# Patient Record
Sex: Female | Born: 1980 | Race: Black or African American | Hispanic: No | Marital: Single | Smoking: Current every day smoker
Health system: Southern US, Community
[De-identification: ages and names within clinical notes are randomized; demographics above are authoritative.]

## PROBLEM LIST (undated history)

## (undated) ENCOUNTER — Inpatient Hospital Stay: Payer: Self-pay

## (undated) DIAGNOSIS — G43909 Migraine, unspecified, not intractable, without status migrainosus: Secondary | ICD-10-CM

## (undated) DIAGNOSIS — R55 Syncope and collapse: Secondary | ICD-10-CM

---

## 2004-12-16 ENCOUNTER — Emergency Department: Payer: Self-pay | Admitting: Internal Medicine

## 2005-05-12 ENCOUNTER — Emergency Department: Payer: Self-pay | Admitting: Emergency Medicine

## 2005-05-15 ENCOUNTER — Ambulatory Visit: Payer: Self-pay | Admitting: Obstetrics and Gynecology

## 2005-05-21 ENCOUNTER — Emergency Department: Payer: Self-pay | Admitting: Emergency Medicine

## 2005-05-22 ENCOUNTER — Ambulatory Visit: Payer: Self-pay | Admitting: Emergency Medicine

## 2005-06-12 ENCOUNTER — Emergency Department: Payer: Self-pay | Admitting: Emergency Medicine

## 2005-07-25 ENCOUNTER — Ambulatory Visit: Payer: Self-pay | Admitting: Family Medicine

## 2005-07-27 ENCOUNTER — Emergency Department: Payer: Self-pay | Admitting: Internal Medicine

## 2005-09-07 ENCOUNTER — Observation Stay: Payer: Self-pay

## 2005-11-02 ENCOUNTER — Observation Stay: Payer: Self-pay

## 2005-11-30 ENCOUNTER — Observation Stay: Payer: Self-pay | Admitting: Obstetrics and Gynecology

## 2005-12-20 ENCOUNTER — Observation Stay: Payer: Self-pay | Admitting: Certified Nurse Midwife

## 2005-12-21 ENCOUNTER — Inpatient Hospital Stay: Payer: Self-pay

## 2005-12-21 ENCOUNTER — Observation Stay: Payer: Self-pay | Admitting: Certified Nurse Midwife

## 2007-03-23 ENCOUNTER — Emergency Department: Payer: Self-pay | Admitting: Emergency Medicine

## 2007-05-18 ENCOUNTER — Emergency Department: Payer: Self-pay | Admitting: Internal Medicine

## 2008-03-31 ENCOUNTER — Emergency Department: Payer: Self-pay | Admitting: Emergency Medicine

## 2009-12-17 ENCOUNTER — Emergency Department: Payer: Self-pay | Admitting: Emergency Medicine

## 2010-10-03 ENCOUNTER — Emergency Department: Payer: Self-pay | Admitting: Unknown Physician Specialty

## 2010-10-11 ENCOUNTER — Emergency Department: Payer: Self-pay | Admitting: Emergency Medicine

## 2010-10-26 ENCOUNTER — Emergency Department: Payer: Self-pay | Admitting: Internal Medicine

## 2010-11-10 ENCOUNTER — Emergency Department: Payer: Self-pay | Admitting: Emergency Medicine

## 2010-11-15 ENCOUNTER — Emergency Department: Payer: Self-pay | Admitting: Emergency Medicine

## 2011-06-04 ENCOUNTER — Emergency Department: Payer: Self-pay | Admitting: Emergency Medicine

## 2011-06-04 LAB — URINALYSIS, COMPLETE
Bacteria: NONE SEEN
Bilirubin,UR: NEGATIVE
Glucose,UR: NEGATIVE mg/dL (ref 0–75)
Leukocyte Esterase: NEGATIVE
Nitrite: NEGATIVE
Ph: 6 (ref 4.5–8.0)
Squamous Epithelial: 2
WBC UR: 4 /HPF (ref 0–5)

## 2011-06-04 LAB — CBC
MCHC: 33.4 g/dL (ref 32.0–36.0)
Platelet: 316 10*3/uL (ref 150–440)
RDW: 14.1 % (ref 11.5–14.5)
WBC: 8.1 10*3/uL (ref 3.6–11.0)

## 2011-06-08 ENCOUNTER — Ambulatory Visit: Payer: Self-pay | Admitting: Obstetrics & Gynecology

## 2011-06-08 LAB — CBC
HGB: 11.6 g/dL — ABNORMAL LOW (ref 12.0–16.0)
MCH: 28.8 pg (ref 26.0–34.0)
MCHC: 33.2 g/dL (ref 32.0–36.0)
Platelet: 345 10*3/uL (ref 150–440)
RDW: 13.1 % (ref 11.5–14.5)

## 2011-06-12 LAB — PATHOLOGY REPORT

## 2011-08-10 ENCOUNTER — Emergency Department: Payer: Self-pay | Admitting: *Deleted

## 2011-08-10 LAB — COMPREHENSIVE METABOLIC PANEL
Albumin: 4 g/dL (ref 3.4–5.0)
Alkaline Phosphatase: 55 U/L (ref 50–136)
BUN: 11 mg/dL (ref 7–18)
Bilirubin,Total: 0.3 mg/dL (ref 0.2–1.0)
Calcium, Total: 8.6 mg/dL (ref 8.5–10.1)
Chloride: 107 mmol/L (ref 98–107)
Co2: 26 mmol/L (ref 21–32)
EGFR (African American): >60
EGFR (Non-African Amer.): >60
Osmolality: 276 (ref 275–301)
Potassium: 3.8 mmol/L (ref 3.5–5.1)
SGOT(AST): 18 U/L (ref 15–37)

## 2011-08-10 LAB — URINALYSIS, COMPLETE
Bacteria: NONE SEEN
Glucose,UR: NEGATIVE mg/dL (ref 0–75)
Leukocyte Esterase: NEGATIVE
Ph: 7 (ref 4.5–8.0)
Protein: NEGATIVE
RBC,UR: 3 /HPF (ref 0–5)
Specific Gravity: 1.016 (ref 1.003–1.030)
Squamous Epithelial: 5
WBC UR: 1 /HPF (ref 0–5)

## 2011-08-10 LAB — CBC
MCH: 28.6 pg (ref 26.0–34.0)
MCHC: 33.4 g/dL (ref 32.0–36.0)
MCV: 86 fL (ref 80–100)
Platelet: 274 10*3/uL (ref 150–440)
RDW: 13.4 % (ref 11.5–14.5)

## 2011-09-28 ENCOUNTER — Ambulatory Visit: Payer: Self-pay | Admitting: Family Medicine

## 2011-10-02 ENCOUNTER — Emergency Department: Payer: Self-pay | Admitting: Emergency Medicine

## 2011-10-21 ENCOUNTER — Emergency Department: Payer: Self-pay | Admitting: Emergency Medicine

## 2011-10-21 LAB — COMPREHENSIVE METABOLIC PANEL
Anion Gap: 8 (ref 7–16)
BUN: 7 mg/dL (ref 7–18)
Chloride: 107 mmol/L (ref 98–107)
Co2: 23 mmol/L (ref 21–32)
EGFR (African American): >60
EGFR (Non-African Amer.): >60
Osmolality: 273 (ref 275–301)
Potassium: 3.7 mmol/L (ref 3.5–5.1)
SGOT(AST): 20 U/L (ref 15–37)
SGPT (ALT): 15 U/L
Total Protein: 7.5 g/dL (ref 6.4–8.2)

## 2011-10-21 LAB — PREGNANCY, URINE: Pregnancy Test, Urine: POSITIVE m[IU]/mL

## 2011-10-21 LAB — URINALYSIS, COMPLETE
Bacteria: NONE SEEN
Bilirubin,UR: NEGATIVE
Blood: NEGATIVE
Ketone: NEGATIVE
Leukocyte Esterase: NEGATIVE
Ph: 8 (ref 4.5–8.0)
RBC,UR: 2 /HPF (ref 0–5)
Squamous Epithelial: 3
WBC UR: NONE SEEN /HPF (ref 0–5)

## 2011-10-21 LAB — WET PREP, GENITAL

## 2011-10-21 LAB — CBC
MCHC: 32.6 g/dL (ref 32.0–36.0)
Platelet: 272 10*3/uL (ref 150–440)
RDW: 13.8 % (ref 11.5–14.5)
WBC: 6.2 10*3/uL (ref 3.6–11.0)

## 2011-11-29 ENCOUNTER — Emergency Department: Payer: Self-pay | Admitting: Emergency Medicine

## 2011-11-29 LAB — COMPREHENSIVE METABOLIC PANEL
Alkaline Phosphatase: 55 U/L (ref 50–136)
Anion Gap: 7 (ref 7–16)
Bilirubin,Total: 0.2 mg/dL (ref 0.2–1.0)
Co2: 24 mmol/L (ref 21–32)
EGFR (Non-African Amer.): >60
Osmolality: 265 (ref 275–301)
Potassium: 3.7 mmol/L (ref 3.5–5.1)
Sodium: 134 mmol/L — ABNORMAL LOW (ref 136–145)

## 2011-11-29 LAB — URINALYSIS, COMPLETE
Bilirubin,UR: NEGATIVE
Blood: NEGATIVE
Glucose,UR: NEGATIVE mg/dL (ref 0–75)
Leukocyte Esterase: NEGATIVE
Nitrite: NEGATIVE
Ph: 7 (ref 4.5–8.0)
RBC,UR: 1 /HPF (ref 0–5)
Squamous Epithelial: 4

## 2011-11-29 LAB — CBC
HGB: 11.2 g/dL — ABNORMAL LOW (ref 12.0–16.0)
MCH: 29 pg (ref 26.0–34.0)
MCHC: 34 g/dL (ref 32.0–36.0)
Platelet: 276 10*3/uL (ref 150–440)
RBC: 3.85 10*6/uL (ref 3.80–5.20)
RDW: 13.7 % (ref 11.5–14.5)

## 2011-11-29 LAB — HCG, QUANTITATIVE, PREGNANCY: Beta Hcg, Quant.: 22281 m[IU]/mL — ABNORMAL HIGH

## 2011-12-26 ENCOUNTER — Ambulatory Visit: Payer: Self-pay | Admitting: Family Medicine

## 2012-01-05 ENCOUNTER — Observation Stay: Payer: Self-pay

## 2012-02-02 ENCOUNTER — Observation Stay: Payer: Self-pay | Admitting: Obstetrics and Gynecology

## 2012-02-02 LAB — URINALYSIS, COMPLETE
Bacteria: NONE SEEN
Bilirubin,UR: NEGATIVE
Blood: NEGATIVE
Glucose,UR: NEGATIVE mg/dL (ref 0–75)
Leukocyte Esterase: NEGATIVE
Protein: NEGATIVE
Specific Gravity: 1.018 (ref 1.003–1.030)
Squamous Epithelial: 2

## 2012-02-03 LAB — URINE CULTURE

## 2012-04-13 ENCOUNTER — Observation Stay: Payer: Self-pay

## 2012-04-13 LAB — URINALYSIS, COMPLETE
Blood: NEGATIVE
Nitrite: NEGATIVE
Ph: 7 (ref 4.5–8.0)
RBC,UR: <1 /HPF (ref 0–5)
Squamous Epithelial: 3

## 2012-05-02 ENCOUNTER — Observation Stay: Payer: Self-pay | Admitting: Obstetrics and Gynecology

## 2012-05-02 LAB — PIH PROFILE
Anion Gap: 8 (ref 7–16)
BUN: 4 mg/dL — ABNORMAL LOW (ref 7–18)
Calcium, Total: 8.5 mg/dL (ref 8.5–10.1)
Chloride: 108 mmol/L — ABNORMAL HIGH (ref 98–107)
Co2: 21 mmol/L (ref 21–32)
EGFR (African American): >60
EGFR (Non-African Amer.): >60
HCT: 31.7 % — ABNORMAL LOW (ref 35.0–47.0)
HGB: 10.7 g/dL — ABNORMAL LOW (ref 12.0–16.0)
MCHC: 33.9 g/dL (ref 32.0–36.0)
Osmolality: 269 (ref 275–301)
RBC: 3.65 10*6/uL — ABNORMAL LOW (ref 3.80–5.20)
Sodium: 137 mmol/L (ref 136–145)
Uric Acid: 4.4 mg/dL (ref 2.6–6.0)

## 2012-05-02 LAB — PROTEIN / CREATININE RATIO, URINE
Creatinine, Urine: 83.2 mg/dL (ref 30.0–125.0)
Protein/Creat. Ratio: 180 mg/gCREAT (ref 0–200)

## 2012-05-07 ENCOUNTER — Inpatient Hospital Stay: Payer: Self-pay | Admitting: Obstetrics and Gynecology

## 2012-05-07 LAB — CBC WITH DIFFERENTIAL/PLATELET
Basophil %: 0.8 %
Eosinophil #: 0.2 10*3/uL (ref 0.0–0.7)
HCT: 34.2 % — ABNORMAL LOW (ref 35.0–47.0)
Lymphocyte %: 26.7 %
MCH: 29 pg (ref 26.0–34.0)
Monocyte #: 0.8 x10 3/mm (ref 0.2–0.9)
Monocyte %: 10.4 %
Neutrophil %: 59.6 %
Platelet: 315 10*3/uL (ref 150–440)
RBC: 3.91 10*6/uL (ref 3.80–5.20)
RDW: 15.3 % — ABNORMAL HIGH (ref 11.5–14.5)
WBC: 8.2 10*3/uL (ref 3.6–11.0)

## 2012-05-08 LAB — HEMATOCRIT: HCT: 28.7 % — ABNORMAL LOW (ref 35.0–47.0)

## 2013-08-05 ENCOUNTER — Emergency Department: Payer: Self-pay | Admitting: Emergency Medicine

## 2013-08-05 LAB — URINALYSIS, COMPLETE
BILIRUBIN, UR: NEGATIVE
Blood: NEGATIVE
GLUCOSE, UR: NEGATIVE mg/dL (ref 0–75)
Ketone: NEGATIVE
Nitrite: NEGATIVE
Ph: 6 (ref 4.5–8.0)
Protein: NEGATIVE
RBC,UR: NONE SEEN /HPF (ref 0–5)
Specific Gravity: 1.006 (ref 1.003–1.030)
Squamous Epithelial: 1
WBC UR: 2 /HPF (ref 0–5)

## 2013-08-05 LAB — GC/CHLAMYDIA PROBE AMP

## 2013-08-05 LAB — WET PREP, GENITAL

## 2013-12-07 ENCOUNTER — Emergency Department: Payer: Self-pay | Admitting: Emergency Medicine

## 2013-12-07 LAB — CBC WITH DIFFERENTIAL/PLATELET
Basophil #: 0.1 10*3/uL (ref 0.0–0.1)
Basophil %: 1.1 %
Eosinophil #: 0.4 10*3/uL (ref 0.0–0.7)
Eosinophil %: 7.2 %
HCT: 37.3 % (ref 35.0–47.0)
HGB: 11.8 g/dL — ABNORMAL LOW (ref 12.0–16.0)
Lymphocyte #: 2 10*3/uL (ref 1.0–3.6)
Lymphocyte %: 34.1 %
MCH: 27.3 pg (ref 26.0–34.0)
MCHC: 31.7 g/dL — ABNORMAL LOW (ref 32.0–36.0)
MCV: 86 fL (ref 80–100)
Monocyte #: 0.5 x10 3/mm (ref 0.2–0.9)
Monocyte %: 8.6 %
Neutrophil #: 2.9 10*3/uL (ref 1.4–6.5)
Neutrophil %: 49 %
Platelet: 297 10*3/uL (ref 150–440)
RBC: 4.34 10*6/uL (ref 3.80–5.20)
RDW: 13.4 % (ref 11.5–14.5)
WBC: 5.9 10*3/uL (ref 3.6–11.0)

## 2013-12-07 LAB — MONONUCLEOSIS SCREEN: MONO TEST: NEGATIVE

## 2013-12-11 LAB — BETA STREP CULTURE(ARMC)

## 2013-12-26 ENCOUNTER — Emergency Department: Payer: Self-pay | Admitting: Emergency Medicine

## 2014-01-20 ENCOUNTER — Emergency Department: Payer: Self-pay | Admitting: Emergency Medicine

## 2014-01-20 LAB — COMPREHENSIVE METABOLIC PANEL
Albumin: 3.9 g/dL (ref 3.4–5.0)
Alkaline Phosphatase: 51 U/L
Anion Gap: 9 (ref 7–16)
BILIRUBIN TOTAL: 0.4 mg/dL (ref 0.2–1.0)
BUN: 13 mg/dL (ref 7–18)
CALCIUM: 8.3 mg/dL — AB (ref 8.5–10.1)
CO2: 22 mmol/L (ref 21–32)
CREATININE: 0.91 mg/dL (ref 0.60–1.30)
Chloride: 112 mmol/L — ABNORMAL HIGH (ref 98–107)
EGFR (African American): >60
EGFR (Non-African Amer.): >60
Glucose: 128 mg/dL — ABNORMAL HIGH (ref 65–99)
OSMOLALITY: 287 (ref 275–301)
Potassium: 3.6 mmol/L (ref 3.5–5.1)
SGOT(AST): 19 U/L (ref 15–37)
SGPT (ALT): 15 U/L
Sodium: 143 mmol/L (ref 136–145)
Total Protein: 7.8 g/dL (ref 6.4–8.2)

## 2014-01-20 LAB — CBC
HCT: 36.8 % (ref 35.0–47.0)
HGB: 12 g/dL (ref 12.0–16.0)
MCH: 27.9 pg (ref 26.0–34.0)
MCHC: 32.8 g/dL (ref 32.0–36.0)
MCV: 85 fL (ref 80–100)
Platelet: 327 10*3/uL (ref 150–440)
RBC: 4.33 10*6/uL (ref 3.80–5.20)
RDW: 13.5 % (ref 11.5–14.5)
WBC: 5.5 10*3/uL (ref 3.6–11.0)

## 2014-01-20 LAB — TROPONIN I: Troponin-I: <0.02 ng/mL

## 2014-01-20 LAB — TSH: Thyroid Stimulating Horm: 0.612 u[IU]/mL

## 2014-03-16 ENCOUNTER — Emergency Department: Payer: Self-pay | Admitting: Emergency Medicine

## 2014-04-15 ENCOUNTER — Emergency Department: Payer: Self-pay | Admitting: Emergency Medicine

## 2014-06-15 ENCOUNTER — Emergency Department: Payer: Self-pay | Admitting: Student

## 2014-07-26 ENCOUNTER — Emergency Department
Admit: 2014-07-26 | Disposition: A | Discharge status: Home / Self Care | Payer: Self-pay | Admitting: Emergency Medicine

## 2014-07-26 NOTE — Op Note (Signed)
PATIENT NAME:  Nancy Watson, [name] MR#:  [id number] DATE OF BIRTH:  March 03, 1981  DATE OF PROCEDURE:  06/08/2011  PREOPERATIVE DIAGNOSIS: Missed abortion.   POSTOPERATIVE DIAGNOSIS: Missed abortion.   PROCEDURE: Suction dilatation and curettage.   SURGEON: Dierdre Searles. Paul Anvi Mangal, M.D.   ANESTHESIA: General.   ESTIMATED BLOOD LOSS: Minimal.  COMPLICATIONS: None.  SPECIMEN: Products of conception.  DISPOSITION: To the recovery room in stable condition.   TECHNIQUE: The patient is prepped and draped in the usual sterile fashion after adequate anesthesia is obtained in the dorsal lithotomy position. Bladder is drained with a Robinson catheter. Speculum is placed and the cervix is grasped with a tenaculum. The cervix is dilated to a size 18 Pratt dilator. An 8 mm rigid suction curette is placed. Aspiration of products of conception is performed. A gentle curettage with a banjo curette is then performed with repeat aspiration. Excellent hemostasis is noted. The tenaculum is removed. The patient goes to the recovery room in stable condition. All sponge, instrument, and needle counts are correct.   ____________________________ R. Annamarie MajorPaul Alisah Grandberry, MD rph:ap D: 06/08/2011 17:31:28 ET T: 06/09/2011 07:03:03 ET JOB#: [id number]  cc: Dierdre Searles. Paul Konstantina Nachreiner, MD, <Dictator> Nadara MustardOBERT P Jyasia Markoff MD ELECTRONICALLY SIGNED 06/09/2011 9:11

## 2014-08-11 NOTE — H&P (Signed)
L&D Evaluation:  History Expanded:   HPI 34 yo G3P2002 with EDD of 05/18/12, presents at 35 weeks with c/o mucousy vaginal d/c and contractions. +FM, no VB. [facility] at [facility], [facility]. H/o SVD x 2 with fairly rapid labors. H/o Cardiac Arrythmia - records state we are awaiting records from [facility] regarding this.    Blood Type (Maternal) O positive    Group B Strep Results Maternal (Result >5wks must be treated as unknown) unknown/result > 5 weeks ago    Maternal HIV Negative    Maternal Syphilis Ab Nonreactive    Maternal Varicella Immune    Rubella Results (Maternal) immune    Maternal T-Dap Unknown    Patient's Medical History Cardiac Arrythmia?    Patient's Surgical History none    Medications Pre Natal Vitamins    Allergies NKDA    Social History tobacco   ROS:   ROS se HPI   Exam:   Vital Signs stable    General no apparent distress    Mental Status clear    Abdomen gravid, tender with contractions    Fetal Position vertex    Pelvic no external lesions, speculum exam: no lesions noted, cervix 2/75/-2    Mebranes Intact    FHT normal rate with no decels    Ucx regular, q 2-5 minutes    Other wet mount negative UA unremarkable   Impression:   Impression PTL   Plan:   Comments Terbutaline IV bolus/hydration Ampicillin   Electronic Signatures: Vella KohlerBrothers, Midori Dado K (CNM)  (Signed 12-Jan-14 00:31)  Authored: L&D Evaluation   Last Updated: 12-Jan-14 00:31 by Vella KohlerBrothers, Deloyd Handy K (CNM)

## 2014-08-11 NOTE — H&P (Signed)
L&D Evaluation:  History Expanded:   HPI 34 yo AA female, EDD of 05/18/12 at 38 weeks 3 days who present in active labor. [facility], h/o genital herpes, and multiple STDs history, plans BTL after this baby papers signed 03/07/12.    Gravida 5    Term 2    PreTerm 0    Abortion 2    Living 2    Blood Type (Maternal) O positive    Group B Strep Results Maternal (Result >5wks must be treated as unknown) positive    Maternal HIV Negative    Maternal Syphilis Ab Nonreactive    Maternal Varicella Immune    Rubella Results (Maternal) immune    EDC 18-May-2012    Presents with contractions    Patient's Medical History Heart Disease  vasovagal reactions    Patient's Surgical History D&C  EAb x1 and SAb x1,    Medications Pre Natal Vitamins  Valtrex    Allergies NKDA    Social History tobacco    Family History Non-Contributory   ROS:   ROS All systems were reviewed.  HEENT, CNS, GI, GU, Respiratory, CV, Renal and Musculoskeletal systems were found to be normal.   Exam:   Vital Signs stable    General no apparent distress    Mental Status clear    Abdomen gravid, tender with contractions    Estimated Fetal Weight Average for gestational age    Fetal Position vertex    Back no CVAT    Edema no edema    Pelvic no external lesions, 8 cm on admission - complete shortly afterwards    FHT normal rate with no decels    Skin dry   Impression:   Impression active labor   Plan:   Comments Pt delivered shortly after arrival on unit. See delivery note.    Follow Up Appointment already scheduled   Electronic Signatures: Vella KohlerBrothers, Lelia Jons K (CNM)  (Signed 04-Feb-14 02:49)  Authored: L&D Evaluation   Last Updated: 04-Feb-14 02:49 by Vella KohlerBrothers, Anoushka Divito K (CNM)

## 2014-08-11 NOTE — H&P (Signed)
L&D Evaluation:  History Expanded:   HPI 34 yo AA female at 37 weeks 4 days who present with wtery discharge and ctx thinks she is in labor, her last babues cam fastpt ha HO gebital herpes, and multiple STDs history, plans BTL after this baby papers signed 03/07/12, Pt is GBS pos,    Gravida 5    Term 2    PreTerm 0    Abortion 2    Living 2    Blood Type (Maternal) O positive    Group B Strep Results Maternal (Result >5wks must be treated as unknown) positive    Maternal HIV Negative    Maternal Syphilis Ab Nonreactive    Maternal Varicella Immune    Rubella Results (Maternal) immune    EDC 18-May-2012    Presents with contractions    Patient's Medical History Heart Disease  vasovagal mreactions    Patient's Surgical History D&C  EAb x1 and SAb x1,    Medications Pre Natal Vitamins    Allergies NKDA    Social History tobacco    Family History Non-Contributory   ROS:   ROS All systems were reviewed.  HEENT, CNS, GI, GU, Respiratory, CV, Renal and Musculoskeletal systems were found to be normal.   Exam:   Urine Protein negative dipstick    General no apparent distress    Mental Status clear    Chest clear    Heart normal sinus rhythm    Abdomen gravid, tender with contractions    Estimated Fetal Weight Average for gestational age    Fetal Position vertex    Back no CVAT    Edema no edema    Pelvic no external lesions, cervix 3-4 and the same after two hours    Mebranes Intact, ballotable head    FHT normal rate with no decels    Ucx irregular    Skin dry   Plan:   Plan monitor contractions and for cervical change, monitor BP    Comments pih labs all normal and no change in cervix    Follow Up Appointment already scheduled   Electronic Signatures: Adria DevonKlett, Isatou Agredano (MD)  (Signed 30-Jan-14 20:49)  Authored: L&D Evaluation   Last Updated: 30-Jan-14 20:49 by Adria DevonKlett, Teniqua Marron (MD)

## 2014-10-21 ENCOUNTER — Emergency Department
Admission: EM | Admit: 2014-10-21 | Discharge: 2014-10-21 | Disposition: A | Discharge status: Home / Self Care | Payer: Medicaid Other | Attending: Emergency Medicine | Admitting: Emergency Medicine

## 2014-10-21 ENCOUNTER — Encounter: Payer: Self-pay | Admitting: Emergency Medicine

## 2014-10-21 DIAGNOSIS — M6281 Muscle weakness (generalized): Secondary | ICD-10-CM | POA: Diagnosis not present

## 2014-10-21 DIAGNOSIS — Z720 Tobacco use: Secondary | ICD-10-CM | POA: Insufficient documentation

## 2014-10-21 DIAGNOSIS — M545 Low back pain, unspecified: Secondary | ICD-10-CM

## 2014-10-21 MED ORDER — CYCLOBENZAPRINE HCL 10 MG PO TABS: 10.0000 mg | ORAL_TABLET | Freq: Three times a day (TID) | ORAL | Status: DC | PRN

## 2014-10-21 MED ORDER — IBUPROFEN 800 MG PO TABS: 800.0000 mg | ORAL_TABLET | Freq: Three times a day (TID) | ORAL | Status: DC | PRN

## 2014-10-21 MED ORDER — TRAMADOL HCL 50 MG PO TABS: 50.0000 mg | ORAL_TABLET | Freq: Four times a day (QID) | ORAL | Status: DC | PRN

## 2014-10-21 NOTE — ED Notes (Signed)
Pt c/o lower back pain, mid to right pain that started x2 days ago. Denies injury.

## 2014-10-21 NOTE — ED Provider Notes (Signed)
[facility] Emergency Department Provider Note  ____________________________________________  Time seen: Approximately 4:23 PM  I have reviewed the triage vital signs and the nursing notes.   HISTORY  Chief Complaint Back Pain   HPI Nancy Watson is a 34 y.o. female presenting to the emergency room for back pain that started Monday. Her pain is an 8 out of 10.  She notes that the pain radiates throughout the right side of her back and through her buttocks but has no pain in her right leg.  She said there was no trauma or heavy lifting prior to the onset.  Patient denies numbness, tingling, or urinary incontinence.  She has had to sleep on her left side and last night she says she slept sitting up due to the pain.  She has not tried any medication and nothing seems to relieve the pain.  She is still able to walk but with severe pain.   No past medical history on file.  There are no active problems to display for this patient.   No past surgical history on file.  Current Outpatient Rx  Name  Route  Sig  Dispense  Refill  . cyclobenzaprine (FLEXERIL) 10 MG tablet   Oral   Take 1 tablet (10 mg total) by mouth every 8 (eight) hours as needed for muscle spasms.   15 tablet   0   . ibuprofen (ADVIL,MOTRIN) 800 MG tablet   Oral   Take 1 tablet (800 mg total) by mouth every 8 (eight) hours as needed for moderate pain.   15 tablet   0   . traMADol (ULTRAM) 50 MG tablet   Oral   Take 1 tablet (50 mg total) by mouth every 6 (six) hours as needed for moderate pain.   12 tablet   0     Allergies Review of patient's allergies indicates no known allergies.  No family history on file.  Social History History  Substance Use Topics  . Smoking status: Current Every Day Smoker  . Smokeless tobacco: Not on file  . Alcohol Use: No    Review of Systems Constitutional: No fever/chills ENT: No sore throat. Cardiovascular: Denies chest  pain. Respiratory: Denies shortness of breath. Gastrointestinal: No abdominal pain.  No nausea, no vomiting.  No diarrhea.  No constipation. Genitourinary: Negative for urinary incontinence. Musculoskeletal: Positive for right sided back pain that radiates to the buttocks. Skin: Negative for rash. Neurological: Negative for headaches or numbness.  Positive for right lower extremity weakness.  10-point ROS otherwise negative.  ____________________________________________   PHYSICAL EXAM:  VITAL SIGNS: ED Triage Vitals  Enc Vitals Group     BP 10/21/14 1620 124/89 mmHg     Pulse Rate 10/21/14 1620 83     Resp --      Temp 10/21/14 1620 98.6 F (37 C)     Temp Source 10/21/14 1620 Oral     SpO2 10/21/14 1620 99 %     Weight 10/21/14 1620 147 lb (66.679 kg)     Height 10/21/14 1620 5\' 7"  (1.702 m)     Head Cir --      Peak Flow --      Pain Score 10/21/14 1622 8     Pain Loc --      Pain Edu? --      Excl. in GC? --     Constitutional: Alert and oriented. Appears uncomfortable sitting on the bed.  Moved very slowly and winced with movement  during the exam. Neck: No cervical spine tenderness to palpation. Cardiovascular: Normal rate, regular rhythm. Grossly normal heart sounds.  Good peripheral circulation. Respiratory: Normal respiratory effort.  No retractions. Lungs CTAB. Gastrointestinal: Soft and nontender. No distention. No abdominal bruits. No CVA tenderness. Musculoskeletal: Tenderness to palpation over the lumbosacral vertebrae and diffuse tenderness over the right side of the back.  Decreased ROM of the right leg and hip. Unable to lift right leg without assistance during most of the exam. Neurologic:  Normal speech and language. No gross focal neurologic deficits are appreciated. No gait instability. Skin:  Skin is warm, dry and intact. No rash noted. Psychiatric: Mood and affect are normal. Speech and behavior are  normal.  ____________________________________________   LABS (all labs ordered are listed, but only abnormal results are displayed)  Labs Reviewed - No data to display ____________________________________________  EKG   ____________________________________________  RADIOLOGY   ____________________________________________   PROCEDURES  Procedure(s) performed: None  Critical Care performed: No  ____________________________________________   INITIAL IMPRESSION / ASSESSMENT AND PLAN / ED COURSE  Pertinent labs & imaging results that were available during my care of the patient were reviewed by me and considered in my medical decision making (see chart for details).  Acute mid low back pain. Patient will be given back exercise sheet. Patient given prescription for Flexeril and ibuprofen and tramadol. Patient advised follow-up family doctor or return by ER condition worsens. ____________________________________________   FINAL CLINICAL IMPRESSION(S) / ED DIAGNOSES  Final diagnoses:  Back pain at L4-L5 level     Joni Reining, PA-C 10/21/14 1645  Emily Filbert, MD 10/22/14 1239

## 2014-11-10 ENCOUNTER — Emergency Department
Admission: EM | Admit: 2014-11-10 | Discharge: 2014-11-10 | Discharge status: Against Medical Advice | Payer: Medicaid Other | Attending: Emergency Medicine | Admitting: Emergency Medicine

## 2014-11-10 ENCOUNTER — Encounter: Payer: Self-pay | Admitting: Emergency Medicine

## 2014-11-10 DIAGNOSIS — R51 Headache: Secondary | ICD-10-CM | POA: Insufficient documentation

## 2014-11-10 DIAGNOSIS — Z720 Tobacco use: Secondary | ICD-10-CM | POA: Diagnosis not present

## 2014-11-10 HISTORY — DX: Migraine, unspecified, not intractable, without status migrainosus: G43.909

## 2014-11-10 NOTE — ED Notes (Signed)
Called for patient in waiting room x 3 no answer.  

## 2014-11-10 NOTE — ED Notes (Signed)
Called for patient in waiting room x 3 no answer.

## 2014-11-10 NOTE — ED Notes (Signed)
Pt to ed with  C/o hedache x 3 days, states hx of migraines, has tried otc motrin without relief. Denies weakness, denies blurred vision.  States sound and lights make head pain worse.

## 2014-12-17 ENCOUNTER — Encounter: Payer: Self-pay | Admitting: Emergency Medicine

## 2014-12-17 ENCOUNTER — Emergency Department
Admission: EM | Admit: 2014-12-17 | Discharge: 2014-12-17 | Disposition: A | Discharge status: Home / Self Care | Payer: Medicaid Other | Attending: Emergency Medicine | Admitting: Emergency Medicine

## 2014-12-17 DIAGNOSIS — Z720 Tobacco use: Secondary | ICD-10-CM | POA: Diagnosis not present

## 2014-12-17 DIAGNOSIS — G5601 Carpal tunnel syndrome, right upper limb: Secondary | ICD-10-CM | POA: Diagnosis not present

## 2014-12-17 DIAGNOSIS — M25531 Pain in right wrist: Secondary | ICD-10-CM | POA: Diagnosis present

## 2014-12-17 MED ORDER — MELOXICAM 15 MG PO TABS: 15.0000 mg | ORAL_TABLET | Freq: Every day | ORAL | Status: DC

## 2014-12-17 NOTE — ED Provider Notes (Signed)
[facility] Emergency Department Provider Note  ____________________________________________  Time seen: Approximately 9:34 AM  I have reviewed the triage vital signs and the nursing notes.   HISTORY  Chief Complaint Wrist Pain   HPI Nancy Watson is a 34 y.o. female to ED for a complaint of R wrist pain x 2 days. Denies injury. Denies loss of ROM or sensation. 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 Pain with movement of wrist and flexion of fingers. No parasthesias or numbness/tingling. Patient has not take anything for same PTA.   LOCATIONR wrist DURATIONx 2 days TIMINGsudden and constant SEVERITYsevere with movement, minor with rest QUALITYSharp CONTEXTfrequent use, repeated use MODIFYINGFACTORSrest ASSOCIATEDSYMPTOMSnone  Past Medical History  Diagnosis Date  . Migraines     There are no active problems to display for this patient.   History reviewed. No pertinent past surgical history.  Current Outpatient Rx  Name  Route  Sig  Dispense  Refill  . cyclobenzaprine (FLEXERIL) 10 MG tablet   Oral   Take 1 tablet (10 mg total) by mouth every 8 (eight) hours as needed for muscle spasms.   15 tablet   0   . ibuprofen (ADVIL,MOTRIN) 800 MG tablet   Oral   Take 1 tablet (800 mg total) by mouth every 8 (eight) hours as needed for moderate pain.   15 tablet   0   . meloxicam (MOBIC) 15 MG tablet   Oral   Take 1 tablet (15 mg total) by mouth daily.   30 tablet   0   . traMADol (ULTRAM) 50 MG tablet   Oral   Take 1 tablet (50 mg total) by mouth every 6 (six) hours as needed for moderate pain.   12 tablet   0     Allergies Review of patient's allergies indicates no known allergies.  History reviewed. No pertinent family history.  Social History Social History  Substance Use Topics  . Smoking status: Current Every Day Smoker    Types: Cigarettes  . Smokeless tobacco: None  . Alcohol Use:  No    Review of Systems Constitutional: No fever/chills Eyes: No visual changes. ENT: No sore throat. Cardiovascular: Denies chest pain. Respiratory: Denies shortness of breath. Gastrointestinal: No abdominal pain.  No nausea, no vomiting.  No diarrhea.  No constipation. Genitourinary: Negative for dysuria. Musculoskeletal: Negative for back pain. Positive for anterior wrist pain. Denies numbness/tingling/parasthesias. No loss of ROM. Skin: Negative for rash. Neurological: Negative for headaches, focal weakness or numbness.  10-point ROS otherwise negative.  ____________________________________________   PHYSICAL EXAM:  VITAL SIGNS: ED Triage Vitals  Enc Vitals Group     BP 12/17/14 0918 128/79 mmHg     Pulse Rate 12/17/14 0918 70     Resp 12/17/14 0918 20     Temp 12/17/14 0918 98.2 F (36.8 C)     Temp Source 12/17/14 0918 Oral     SpO2 12/17/14 0918 99 %     Weight 12/17/14 0918 149 lb (67.586 kg)     Height 12/17/14 0918 5\' 6"  (1.676 m)     Head Cir --      Peak Flow --      Pain Score 12/17/14 0918 10     Pain Loc --      Pain Edu? --      Excl. in GC? --     Constitutional: Alert and oriented. Well appearing and in no acute distress. Eyes: Conjunctivae are normal.  PERRL. EOMI. Head: Atraumatic. Nose: No congestion/rhinnorhea. Mouth/Throat: Mucous membranes are moist.  Oropharynx non-erythematous. Neck: No stridor. No cervical spine tenderness to palpation. Hematological/Lymphatic/Immunilogical: No cervical lymphadenopathy. Cardiovascular: Normal rate, regular rhythm. Grossly normal heart sounds.  Good peripheral circulation. Respiratory: Normal respiratory effort.  No retractions. Lungs CTAB. Gastrointestinal: Soft and nontender. No distention. No abdominal bruits. No CVA tenderness. Musculoskeletal: No lower extremity tenderness nor edema.  No joint effusions. Positive Tinel's and Phalen's. Full range of motion to wrist, pain with movement of both flexion  and extension. No abnormality noted to elbow or phalanges. No signs of trauma or wounds. No ecchymosis. No edema. Neurologic:  Normal speech and language. No gross focal neurologic deficits are appreciated. No gait instability. Good sensation distal right phalanges Skin:  Skin is warm, dry and intact. No rash noted. Psychiatric: Mood and affect are normal. Speech and behavior are normal.  ____________________________________________   LABS (all labs ordered are listed, but only abnormal results are displayed)  Labs Reviewed - No data to display ____________________________________________  EKG   ____________________________________________  RADIOLOGY   ____________________________________________   PROCEDURES  Procedure(s) performed:    ____________________________________________   INITIAL IMPRESSION / ASSESSMENT AND PLAN / ED COURSE  Pertinent labs & imaging results that were available during my care of the patient were reviewed by me and considered in my medical decision making (see chart for details).  Exam most consistent with carpal tunnel syndrome. No numbness no tingling. No neurologic deficits. Given specific follow-up structures to use medication daily 30 days. Use brace. Patient understands and verbalizes compliance. Patient will follow up with Ortho if symptoms continue past 3 weeks. ____________________________________________   FINAL CLINICAL IMPRESSION(S) / ED DIAGNOSES  Final diagnoses:  Carpal tunnel syndrome of right wrist      Racheal Patches, PA-C 12/17/14 1020

## 2014-12-17 NOTE — Discharge Instructions (Signed)
Carpal Tunnel Syndrome The carpal tunnel is a narrow area located on the palm side of your wrist. The tunnel is formed by the wrist bones and ligaments. Nerves, blood vessels, and tendons pass through the carpal tunnel. Repeated wrist motion or certain diseases may cause swelling within the tunnel. This swelling pinches the main nerve in the wrist (median nerve) and causes the painful hand and arm condition called carpal tunnel syndrome. CAUSES   Repeated wrist motions.  Wrist injuries.  Certain diseases like arthritis, diabetes, alcoholism, hyperthyroidism, and kidney failure.  Obesity.  Pregnancy. SYMPTOMS   A "pins and needles" feeling in your fingers or hand, especially in your thumb, index and middle fingers.  Tingling or numbness in your fingers or hand.  An aching feeling in your entire arm, especially when your wrist and elbow are bent for long periods of time.  Wrist pain that goes up your arm to your shoulder.  Pain that goes down into your palm or fingers.  A weak feeling in your hands. DIAGNOSIS  Your health care provider will take your history and perform a physical exam. An electromyography test may be needed. This test measures electrical signals sent out by your nerves into the muscles. The electrical signals are usually slowed by carpal tunnel syndrome. You may also need X-rays. TREATMENT  Carpal tunnel syndrome may clear up by itself. Your health care provider may recommend a wrist splint or medicine such as a nonsteroidal anti-inflammatory medicine. Cortisone injections may help. Sometimes, surgery may be needed to free the pinched nerve.  HOME CARE INSTRUCTIONS   Take all medicine as directed by your health care provider. Only take over-the-counter or prescription medicines for pain, discomfort, or fever as directed by your health care provider.  If you were given a splint to keep your wrist from bending, wear it as directed. It is important to wear the splint at  night. Wear the splint for as long as you have pain or numbness in your hand, arm, or wrist. This may take 1 to 2 months.  Rest your wrist from any activity that may be causing your pain. If your symptoms are work-related, you may need to talk to your employer about changing to a job that does not require using your wrist.  Put ice on your wrist after long periods of wrist activity.  Put ice in a plastic bag.  Place a towel between your skin and the bag.  Leave the ice on for 15-20 minutes, 03-04 times a day.  Keep all follow-up visits as directed by your health care provider. This includes any orthopedic referrals, physical therapy, and rehabilitation. Any delay in getting necessary care could result in a delay or failure of your condition to heal. SEEK IMMEDIATE MEDICAL CARE IF:   You have new, unexplained symptoms.  Your symptoms get worse and are not helped or controlled with medicines. MAKE SURE YOU:   Understand these instructions.  Will watch your condition.  Will get help right away if you are not doing well or get worse. Document Released: 03/17/2000 Document Revised: 08/04/2013 Document Reviewed: 02/03/2011 [employer] Patient Information 2015 [employer]. This information is not intended to replace advice given to you by your health care provider. Make sure you discuss any questions you have with your health care provider.  

## 2014-12-17 NOTE — ED Notes (Signed)
Pt to ed with c/o right wrist pain x 2 days, denies injury but states she does use wrist frequently at her work.  +pulse, movement and sensation in wrist.  Increased pain with movement.

## 2015-02-11 ENCOUNTER — Encounter: Payer: Self-pay | Admitting: *Deleted

## 2015-02-11 ENCOUNTER — Emergency Department
Admission: EM | Admit: 2015-02-11 | Discharge: 2015-02-11 | Disposition: A | Discharge status: Home / Self Care | Payer: Medicaid Other | Attending: Emergency Medicine | Admitting: Emergency Medicine

## 2015-02-11 ENCOUNTER — Emergency Department: Payer: Medicaid Other

## 2015-02-11 DIAGNOSIS — R0789 Other chest pain: Secondary | ICD-10-CM | POA: Diagnosis present

## 2015-02-11 DIAGNOSIS — Z720 Tobacco use: Secondary | ICD-10-CM | POA: Insufficient documentation

## 2015-02-11 DIAGNOSIS — J069 Acute upper respiratory infection, unspecified: Secondary | ICD-10-CM | POA: Insufficient documentation

## 2015-02-11 DIAGNOSIS — Z791 Long term (current) use of non-steroidal anti-inflammatories (NSAID): Secondary | ICD-10-CM | POA: Diagnosis not present

## 2015-02-11 HISTORY — DX: Syncope and collapse: R55

## 2015-02-11 MED ORDER — IBUPROFEN 600 MG PO TABS
600.0000 mg | ORAL_TABLET | Freq: Once | ORAL | Status: AC
  Administered 2015-02-11: 600 mg via ORAL
  Filled 2015-02-11: qty 1

## 2015-02-11 NOTE — ED Notes (Signed)
Patient transported to X-ray 

## 2015-02-11 NOTE — Discharge Instructions (Signed)

## 2015-02-11 NOTE — ED Notes (Signed)
Pt reports feeling fatigued over the past 3 days with constant CP to center and R side of chest. Worse with deep inspiration. Pt reports chills at home. Nonproductive cough with nasal congestion. Afebrile. Pt alert and oriented X4, active, cooperative, pt in NAD. RR even and unlabored, color WNL.

## 2015-02-11 NOTE — ED Provider Notes (Signed)
Time Seen: Approximately 834  I have reviewed the triage notes  Chief Complaint: Chest Pain   History of Present Illness: Nancy Watson is a 34 y.o. female who presents with right-sided chest discomfort. Patient's points mainly to the right substernal region states she has pain which hurts her with movement and deep inspiration. She denies any shortness of breath. She has had a dry nonproductive cough at home with some nasal congestion. She is not aware of any high fevers. She denies any arm or jaw pain or back discomfort at this time. Patient's a frequent visitor here for the emergency department for nonsignificant medical problems. She states she is on birth control denies any risk of pregnancy and has no history of blood clots in her lungs or legs. She denies any leg pain or swelling.   Past Medical History  Diagnosis Date  . Migraines   . Syncope     There are no active problems to display for this patient.   History reviewed. No pertinent past surgical history.  History reviewed. No pertinent past surgical history.  Current Outpatient Rx  Name  Route  Sig  Dispense  Refill  . cyclobenzaprine (FLEXERIL) 10 MG tablet   Oral   Take 1 tablet (10 mg total) by mouth every 8 (eight) hours as needed for muscle spasms.   15 tablet   0   . ibuprofen (ADVIL,MOTRIN) 800 MG tablet   Oral   Take 1 tablet (800 mg total) by mouth every 8 (eight) hours as needed for moderate pain.   15 tablet   0   . meloxicam (MOBIC) 15 MG tablet   Oral   Take 1 tablet (15 mg total) by mouth daily.   30 tablet   0   . traMADol (ULTRAM) 50 MG tablet   Oral   Take 1 tablet (50 mg total) by mouth every 6 (six) hours as needed for moderate pain.   12 tablet   0     Allergies:  Review of patient's allergies indicates no known allergies.  Family History: No family history on file.  Social History: Social History  Substance Use Topics  . Smoking status: Current Every Day Smoker   Types: Cigarettes  . Smokeless tobacco: None  . Alcohol Use: No     Review of Systems:   10 point review of systems was performed and was otherwise negative:  Constitutional: No fever Eyes: No visual disturbances ENT: No sore throat, ear pain Cardiac: No chest pain Respiratory: No shortness of breath, wheezing, or stridor Abdomen: No abdominal pain, no vomiting, No diarrhea Endocrine: No weight loss, No night sweats Extremities: No peripheral edema, cyanosis Skin: No rashes, easy bruising Neurologic: No focal weakness, trouble with speech or swollowing Urologic: No dysuria, Hematuria, or urinary frequency   Physical Exam:  ED Triage Vitals  Enc Vitals Group     BP 02/11/15 0812 124/82 mmHg     Pulse Rate 02/11/15 0812 85     Resp 02/11/15 0830 17     Temp 02/11/15 0812 98.4 F (36.9 C)     Temp Source 02/11/15 0812 Oral     SpO2 02/11/15 0812 100 %     Weight 02/11/15 0812 145 lb (65.772 kg)     Height 02/11/15 0812  (1.702 m)     Head Cir --      Peak Flow --      Pain Score 02/11/15 0822 9     Pain Loc --  Pain Edu? --      Excl. in GC? --     General: Awake , Alert , and Oriented times 3; GCS 15 Head: Normal cephalic , atraumatic Eyes: Pupils equal , round, reactive to light Nose/Throat: No nasal drainage, patent upper airway without erythema or exudate.  Neck: Supple, Full range of motion, No anterior adenopathy or palpable thyroid masses Lungs: Clear to ascultation without wheezes , rhonchi, or rales Heart: Regular rate, regular rhythm without murmurs , gallops , or rubs Abdomen: Soft, non tender without rebound, guarding , or rigidity; bowel sounds positive and symmetric in all 4 quadrants. No organomegaly .        Extremities: 2 plus symmetric pulses. No edema, clubbing or cyanosis Neurologic: normal ambulation, Motor symmetric without deficits, sensory intact Skin: warm, dry, no rashes Patient has reproducible pain over the right costochondral  area without any crepitus or step-off noted. She states palpation here does reproduce her discomfort.  EKG:  ED ECG REPORT I, Jennye MoccasinBrian S Quigley, the attending physician, personally viewed and interpreted this ECG.  Date: 02/11/2015 EKG Time: 0816 Rate: 80 Rhythm: normal sinus rhythm QRS Axis: normal Intervals: normal ST/T Wave abnormalities: normal Conduction Disutrbances: none Narrative Interpretation: unremarkable Normal EKG   Radiology: EXAM: CHEST 2 VIEW  COMPARISON: June 15, 2014  FINDINGS: Lungs are clear. Heart size and pulmonary vascularity are normal. No adenopathy. No pneumothorax. No bone lesions.  IMPRESSION: No abnormality noted.   Electronically Signed By: Bretta BangWilliam Woodruff III M.D. On: 02/11/2015 09:02           I personally reviewed the radiologic studies     ED Course: * Differential includes all life-threatening causes for chest pain. This includes but is not exclusive to acute coronary syndrome, aortic dissection, pulmonary embolism, cardiac tamponade, community-acquired pneumonia, pericarditis, musculoskeletal chest wall pain, etc. Given the patient's current clinical presentation, risk factors, physical exam, etc. I felt most likely this was musculoskeletal chest wall pain. She was advised take over-the-counter medications for cough and cold and also for pain including ibuprofen.   Assessment: * Acute chest wall pain Mild upper respiratory tract infection     Plan:  Outpatient management Patient was advised to return immediately if condition worsens. Patient was advised to follow up with her primary care physician or other specialized physicians involved and in their current assessment.             Jennye MoccasinBrian S Quigley, MD 02/11/15 [phone]

## 2015-02-11 NOTE — ED Notes (Signed)
Pt reports chest pain since last night with shortness of breath, pt states" It feels like someone is sitting on my chest"

## 2015-02-11 NOTE — ED Notes (Signed)
Pt alert and oriented X4, active, cooperative, pt in NAD. RR even and unlabored, color WNL.  Pt informed to return if any life threatening symptoms occur.   

## 2015-02-15 NOTE — ED Provider Notes (Signed)
Medical screening examination/treatment/procedure(s) were performed by non-physician practitioner and as supervising physician I was immediately available for consultation/collaboration.  Marland Reine, MD 02/15/15 1043 

## 2015-04-29 ENCOUNTER — Emergency Department
Admission: EM | Admit: 2015-04-29 | Discharge: 2015-04-29 | Disposition: A | Discharge status: Home / Self Care | Payer: Medicaid Other | Attending: Emergency Medicine | Admitting: Emergency Medicine

## 2015-04-29 ENCOUNTER — Emergency Department: Payer: Medicaid Other

## 2015-04-29 ENCOUNTER — Encounter: Payer: Self-pay | Admitting: *Deleted

## 2015-04-29 DIAGNOSIS — F17210 Nicotine dependence, cigarettes, uncomplicated: Secondary | ICD-10-CM | POA: Diagnosis not present

## 2015-04-29 DIAGNOSIS — Z3202 Encounter for pregnancy test, result negative: Secondary | ICD-10-CM | POA: Insufficient documentation

## 2015-04-29 DIAGNOSIS — R109 Unspecified abdominal pain: Secondary | ICD-10-CM | POA: Diagnosis present

## 2015-04-29 DIAGNOSIS — R1084 Generalized abdominal pain: Secondary | ICD-10-CM | POA: Insufficient documentation

## 2015-04-29 LAB — COMPREHENSIVE METABOLIC PANEL
ALT: 9 U/L — ABNORMAL LOW (ref 14–54)
AST: 14 U/L — AB (ref 15–41)
Albumin: 4.4 g/dL (ref 3.5–5.0)
Alkaline Phosphatase: 48 U/L (ref 38–126)
Anion gap: 9 (ref 5–15)
BILIRUBIN TOTAL: 0.8 mg/dL (ref 0.3–1.2)
BUN: 8 mg/dL (ref 6–20)
CO2: 20 mmol/L — ABNORMAL LOW (ref 22–32)
Calcium: 8.9 mg/dL (ref 8.9–10.3)
Chloride: 107 mmol/L (ref 101–111)
Creatinine, Ser: 0.68 mg/dL (ref 0.44–1.00)
GFR calc Af Amer: >60 mL/min (ref >60)
Glucose, Bld: 95 mg/dL (ref 65–99)
Potassium: 3.7 mmol/L (ref 3.5–5.1)
Sodium: 136 mmol/L (ref 135–145)
Total Protein: 7.6 g/dL (ref 6.5–8.1)

## 2015-04-29 LAB — URINALYSIS COMPLETE WITH MICROSCOPIC (ARMC ONLY)
BILIRUBIN URINE: NEGATIVE
GLUCOSE, UA: NEGATIVE mg/dL
KETONES UR: NEGATIVE mg/dL
LEUKOCYTES UA: NEGATIVE
Nitrite: NEGATIVE
PH: 7 (ref 5.0–8.0)
Protein, ur: NEGATIVE mg/dL
Specific Gravity, Urine: 1.005 (ref 1.005–1.030)

## 2015-04-29 LAB — CBC
HCT: 37.9 % (ref 35.0–47.0)
Hemoglobin: 12.6 g/dL (ref 12.0–16.0)
MCH: 27.6 pg (ref 26.0–34.0)
MCHC: 33.3 g/dL (ref 32.0–36.0)
MCV: 82.6 fL (ref 80.0–100.0)
PLATELETS: 315 10*3/uL (ref 150–440)
RBC: 4.58 MIL/uL (ref 3.80–5.20)
RDW: 13.8 % (ref 11.5–14.5)
WBC: 5.2 10*3/uL (ref 3.6–11.0)

## 2015-04-29 LAB — LIPASE, BLOOD: Lipase: 23 U/L (ref 11–51)

## 2015-04-29 LAB — POCT PREGNANCY, URINE: Preg Test, Ur: NEGATIVE

## 2015-04-29 MED ORDER — DICYCLOMINE HCL 20 MG PO TABS: 20.0000 mg | ORAL_TABLET | Freq: Three times a day (TID) | ORAL | Status: DC | PRN

## 2015-04-29 MED ORDER — FAMOTIDINE 20 MG PO TABS: 20.0000 mg | ORAL_TABLET | Freq: Two times a day (BID) | ORAL | Status: DC

## 2015-04-29 MED ORDER — OXYCODONE-ACETAMINOPHEN 5-325 MG PO TABS
2.0000 | ORAL_TABLET | Freq: Once | ORAL | Status: AC
  Administered 2015-04-29: 2 via ORAL
  Filled 2015-04-29: qty 2

## 2015-04-29 MED ORDER — FAMOTIDINE 20 MG PO TABS
20.0000 mg | ORAL_TABLET | Freq: Once | ORAL | Status: AC
  Administered 2015-04-29: 20 mg via ORAL
  Filled 2015-04-29: qty 1

## 2015-04-29 NOTE — ED Provider Notes (Signed)
[facility] Emergency Department Provider Note     Time seen: ----------------------------------------- 1:06 PM on 04/29/2015 -----------------------------------------    I have reviewed the triage vital signs and the nursing notes.   HISTORY  Chief Complaint Abdominal Pain    HPI Nancy Watson is a 35 y.o. female who presents ER with sudden onset abdominal pain that started about 3 AM. Patient states she thought she had used the bathroom but did not. She denies fevers, chills, chest pain, shortness of breath, nausea vomiting or diarrhea. Patient states pain is better when she leans forward, has never had a history of this before. She denies any vaginal bleeding or discharge.   Past Medical History  Diagnosis Date  . Migraines   . Syncope     There are no active problems to display for this patient.   History reviewed. No pertinent past surgical history.  Allergies Review of patient's allergies indicates no known allergies.  Social History Social History  Substance Use Topics  . Smoking status: Current Every Day Smoker    Types: Cigarettes  . Smokeless tobacco: None  . Alcohol Use: No    Review of Systems Constitutional: Negative for fever. Eyes: Negative for visual changes. ENT: Negative for sore throat. Cardiovascular: Negative for chest pain. Respiratory: Negative for shortness of breath. Gastrointestinal: Positive for abdominal pain, negative vomiting and diarrhea Genitourinary: Negative for dysuria. Musculoskeletal: Negative for back pain. Skin: Negative for rash. Neurological: Negative for headaches, focal weakness or numbness.  10-point ROS otherwise negative.  ____________________________________________   PHYSICAL EXAM:  VITAL SIGNS: ED Triage Vitals  Enc Vitals Group     BP 04/29/15 1114 118/81 mmHg     Pulse Rate 04/29/15 1114 86     Resp 04/29/15 1114 20     Temp 04/29/15 1114 98.2 F (36.8 C)     Temp  Source 04/29/15 1114 Oral     SpO2 04/29/15 1114 93 %     Weight 04/29/15 1114 155 lb (70.308 kg)     Height 04/29/15 1114  (1.676 m)     Head Cir --      Peak Flow --      Pain Score 04/29/15 1117 10     Pain Loc --      Pain Edu? --      Excl. in GC? --     Constitutional: Alert and oriented. Well appearing and in no distress. Eyes: Conjunctivae are normal. PERRL. Normal extraocular movements. ENT   Head: Normocephalic and atraumatic.   Nose: No congestion/rhinnorhea.   Mouth/Throat: Mucous membranes are moist.   Neck: No stridor. Cardiovascular: Normal rate, regular rhythm. Normal and symmetric distal pulses are present in all extremities. No murmurs, rubs, or gallops. Respiratory: Normal respiratory effort without tachypnea nor retractions. Breath sounds are clear and equal bilaterally. No wheezes/rales/rhonchi. Gastrointestinal: Right upper quadrant abdominal tenderness, no rebound or guarding. Normal bowel sounds. Musculoskeletal: Nontender with normal range of motion in all extremities. No joint effusions.  No lower extremity tenderness nor edema. Neurologic:  Normal speech and language. No gross focal neurologic deficits are appreciated. Speech is normal. No gait instability. Skin:  Skin is warm, dry and intact. No rash noted. Psychiatric: Mood and affect are normal. Speech and behavior are normal. Patient exhibits appropriate insight and judgment. ___________________________________________  ED COURSE:  Pertinent labs & imaging results that were available during my care of the patient were reviewed by me and considered in my medical decision making (see chart  for details). Patient is in no acute distress, will receive oral pain medication and ultrasound, basic labs ____________________________________________    LABS (pertinent positives/negatives)  Labs Reviewed  COMPREHENSIVE METABOLIC PANEL - Abnormal; Notable for the following:    CO2 20 (*)     AST 14 (*)    ALT 9 (*)    All other components within normal limits  URINALYSIS COMPLETEWITH MICROSCOPIC ([facility] ONLY) - Abnormal; Notable for the following:    Color, Urine STRAW (*)    APPearance CLEAR (*)    Hgb urine dipstick 1+ (*)    Bacteria, UA RARE (*)    Squamous Epithelial / LPF 6-30 (*)    All other components within normal limits  LIPASE, BLOOD  CBC  POC URINE PREG, ED  POCT PREGNANCY, URINE    RADIOLOGY Images were viewed by me  Right upper quadrant ultrasound, KUB IMPRESSION: Study within normal limits. IMPRESSION: The bowel gas pattern is unremarkable. No appreciable bowel obstruction or free air. ____________________________________________  FINAL ASSESSMENT AND PLAN  Abdominal pain  Plan: Patient with labs and imaging as dictated above. No clear etiology for her abdominal pain is been identified. Her labs and ultrasound and x-ray are unremarkable. Patient has no lower abdominal tenderness or vaginal complaints. She is stable for outpatient follow-up with her doctor.   Emily Filbert, MD   Emily Filbert, MD 04/29/15 [phone]

## 2015-04-29 NOTE — ED Notes (Addendum)
Pt states sudden onset of mid abd pain this AM at 0300, states she thought she had to use the bathroom but did not have a BM, denies any nausea or vomiting, pt in no acute distress, states pain is better when she leans forward

## 2015-04-29 NOTE — Discharge Instructions (Signed)

## 2015-04-29 NOTE — ED Notes (Signed)
Patient transported to Ultrasound 

## 2015-04-29 NOTE — ED Notes (Signed)
Patient transported to X-ray 

## 2015-06-07 ENCOUNTER — Emergency Department
Admission: EM | Admit: 2015-06-07 | Discharge: 2015-06-07 | Disposition: A | Discharge status: Home / Self Care | Payer: Medicaid Other | Attending: Emergency Medicine | Admitting: Emergency Medicine

## 2015-06-07 DIAGNOSIS — Z79899 Other long term (current) drug therapy: Secondary | ICD-10-CM | POA: Insufficient documentation

## 2015-06-07 DIAGNOSIS — Z3202 Encounter for pregnancy test, result negative: Secondary | ICD-10-CM | POA: Insufficient documentation

## 2015-06-07 DIAGNOSIS — F17210 Nicotine dependence, cigarettes, uncomplicated: Secondary | ICD-10-CM | POA: Diagnosis not present

## 2015-06-07 DIAGNOSIS — R197 Diarrhea, unspecified: Secondary | ICD-10-CM | POA: Diagnosis present

## 2015-06-07 DIAGNOSIS — A084 Viral intestinal infection, unspecified: Secondary | ICD-10-CM

## 2015-06-07 LAB — BASIC METABOLIC PANEL
Anion gap: 9 (ref 5–15)
BUN: 10 mg/dL (ref 6–20)
CHLORIDE: 108 mmol/L (ref 101–111)
CO2: 19 mmol/L — ABNORMAL LOW (ref 22–32)
CREATININE: 0.78 mg/dL (ref 0.44–1.00)
Calcium: 8.8 mg/dL — ABNORMAL LOW (ref 8.9–10.3)
GFR calc Af Amer: >60 mL/min (ref >60)
GFR calc non Af Amer: >60 mL/min (ref >60)
Glucose, Bld: 90 mg/dL (ref 65–99)
POTASSIUM: 3.7 mmol/L (ref 3.5–5.1)
Sodium: 136 mmol/L (ref 135–145)

## 2015-06-07 LAB — CBC WITH DIFFERENTIAL/PLATELET
BASOS ABS: 0 10*3/uL (ref 0–0.1)
Basophils Relative: 1 %
EOS ABS: 0.1 10*3/uL (ref 0–0.7)
EOS PCT: 3 %
HEMATOCRIT: 38.6 % (ref 35.0–47.0)
Hemoglobin: 13 g/dL (ref 12.0–16.0)
Lymphocytes Relative: 29 %
Lymphs Abs: 1.5 10*3/uL (ref 1.0–3.6)
MCH: 28.5 pg (ref 26.0–34.0)
MCHC: 33.8 g/dL (ref 32.0–36.0)
MCV: 84.4 fL (ref 80.0–100.0)
MONOS PCT: 10 %
Monocytes Absolute: 0.5 10*3/uL (ref 0.2–0.9)
Neutro Abs: 3 10*3/uL (ref 1.4–6.5)
Neutrophils Relative %: 57 %
PLATELETS: 282 10*3/uL (ref 150–440)
RBC: 4.57 MIL/uL (ref 3.80–5.20)
RDW: 13.7 % (ref 11.5–14.5)
WBC: 5.2 10*3/uL (ref 3.6–11.0)

## 2015-06-07 LAB — URINALYSIS COMPLETE WITH MICROSCOPIC (ARMC ONLY)
BILIRUBIN URINE: NEGATIVE
GLUCOSE, UA: NEGATIVE mg/dL
Ketones, ur: NEGATIVE mg/dL
LEUKOCYTES UA: NEGATIVE
NITRITE: NEGATIVE
Protein, ur: NEGATIVE mg/dL
SPECIFIC GRAVITY, URINE: 1.026 (ref 1.005–1.030)
pH: 5 (ref 5.0–8.0)

## 2015-06-07 LAB — PREGNANCY, URINE: Preg Test, Ur: NEGATIVE

## 2015-06-07 MED ORDER — ONDANSETRON HCL 4 MG PO TABS: 4.0000 mg | ORAL_TABLET | Freq: Three times a day (TID) | ORAL | Status: DC | PRN

## 2015-06-07 MED ORDER — ONDANSETRON HCL 4 MG/2ML IJ SOLN
4.0000 mg | Freq: Once | INTRAMUSCULAR | Status: AC
  Administered 2015-06-07: 4 mg via INTRAVENOUS
  Filled 2015-06-07: qty 2

## 2015-06-07 MED ORDER — LOPERAMIDE HCL 2 MG PO TABS: 2.0000 mg | ORAL_TABLET | Freq: Four times a day (QID) | ORAL | Status: DC | PRN

## 2015-06-07 MED ORDER — SODIUM CHLORIDE 0.9 % IV BOLUS (SEPSIS)
1000.0000 mL | Freq: Once | INTRAVENOUS | Status: AC
  Administered 2015-06-07: 1000 mL via INTRAVENOUS

## 2015-06-07 NOTE — Discharge Instructions (Signed)

## 2015-06-07 NOTE — ED Provider Notes (Signed)
[facility] Emergency Department Provider Note  ____________________________________________  Time seen: Approximately 11:38 AM  I have reviewed the triage vital signs and the nursing notes.   HISTORY  Chief Complaint Emesis and Diarrhea    HPI Nancy Watson is a 35 y.o. female patient complaining of nausea vomiting diarrhea over 24 hours. Patient also complaining of body aches. Patient denies any URI signs symptoms. Patient state no flu shot this season. No palliative measures taken for this complaint. Patient states she's had 6 episodes of loose stools prior to arrival and one episode in the ER. Patient last vomiting episode was prior to arrival. Patient rates her pain discomfort as 8/10.  Past Medical History  Diagnosis Date  . Migraines   . Syncope     There are no active problems to display for this patient.   No past surgical history on file.  Current Outpatient Rx  Name  Route  Sig  Dispense  Refill  . cyclobenzaprine (FLEXERIL) 10 MG tablet   Oral   Take 1 tablet (10 mg total) by mouth every 8 (eight) hours as needed for muscle spasms. Patient not taking: Reported on 02/11/2015   15 tablet   0   . DENTAGEL 1.1 % GEL dental gel   Mouth/Throat   Use as directed 1 application in the mouth or throat at bedtime. Use a pea-sized amount           Dispense as written.   . dicyclomine (BENTYL) 20 MG tablet   Oral   Take 1 tablet (20 mg total) by mouth 3 (three) times daily as needed for spasms.   30 tablet   0   . famotidine (PEPCID) 20 MG tablet   Oral   Take 1 tablet (20 mg total) by mouth 2 (two) times daily.   60 tablet   1   . ibuprofen (ADVIL,MOTRIN) 800 MG tablet   Oral   Take 1 tablet (800 mg total) by mouth every 8 (eight) hours as needed for moderate pain. Patient not taking: Reported on 02/11/2015   15 tablet   0   . loperamide (IMODIUM A-D) 2 MG tablet   Oral   Take 1 tablet (2 mg total) by mouth 4 (four) times  daily as needed for diarrhea or loose stools.   12 tablet   0   . meloxicam (MOBIC) 15 MG tablet   Oral   Take 1 tablet (15 mg total) by mouth daily. Patient not taking: Reported on 02/11/2015   30 tablet   0   . ondansetron (ZOFRAN) 4 MG tablet   Oral   Take 1 tablet (4 mg total) by mouth every 8 (eight) hours as needed for nausea or vomiting.   12 tablet   0   . SUMAtriptan (IMITREX) 25 MG tablet   Oral   Take 25 mg by mouth every 2 (two) hours as needed for migraine.          . traMADol (ULTRAM) 50 MG tablet   Oral   Take 1 tablet (50 mg total) by mouth every 6 (six) hours as needed for moderate pain. Patient not taking: Reported on 02/11/2015   12 tablet   0     Allergies Review of patient's allergies indicates no known allergies.  No family history on file.  Social History Social History  Substance Use Topics  . Smoking status: Current Every Day Smoker    Types: Cigarettes  . Smokeless tobacco: Not on  file  . Alcohol Use: No    Review of Systems Constitutional: No fever/chills Eyes: No visual changes. ENT: No sore throat. Cardiovascular: Denies chest pain. Respiratory: Denies shortness of breath. Gastrointestinal: No abdominal pain.. Nausea vomiting diarrhea..  No constipation. Genitourinary: Negative for dysuria. Musculoskeletal: Negative for back pain. Skin: Negative for rash. Neurological: Negative for headaches, focal weakness or numbness.   ____________________________________________   PHYSICAL EXAM:  VITAL SIGNS: ED Triage Vitals  Enc Vitals Group     BP 06/07/15 1026 133/83 mmHg     Pulse Rate 06/07/15 1026 97     Resp 06/07/15 1026 18     Temp 06/07/15 1026 98.2 F (36.8 C)     Temp Source 06/07/15 1026 Oral     SpO2 06/07/15 1026 100 %     Weight 06/07/15 1026 140 lb (63.504 kg)     Height 06/07/15 1026  (1.676 m)     Head Cir --      Peak Flow --      Pain Score 06/07/15 1038 8     Pain Loc --      Pain Edu? --       Excl. in GC? --     Constitutional: Alert and oriented. Well appearing and in no acute distress. Eyes: Conjunctivae are normal. PERRL. EOMI. Head: Atraumatic. Nose: No congestion/rhinnorhea. Mouth/Throat: Mucous membranes are moist.  Oropharynx non-erythematous. Neck: No stridor.  No cervical spine tenderness to palpation. Hematological/Lymphatic/Immunilogical: No cervical lymphadenopathy. Cardiovascular: Normal rate, regular rhythm. Grossly normal heart sounds.  Good peripheral circulation. Respiratory: Normal respiratory effort.  No retractions. Lungs CTAB. Gastrointestinal: No abdominal guarding with palpation. No distention. No abdominal bruits. No CVA tenderness. Hyperactive bowel sounds Musculoskeletal: No lower extremity tenderness nor edema.  No joint effusions. Neurologic:  Normal speech and language. No gross focal neurologic deficits are appreciated. No gait instability. Skin:  Skin is warm, dry and intact. No rash noted. Psychiatric: Mood and affect are normal. Speech and behavior are normal.  ____________________________________________   LABS (all labs ordered are listed, but only abnormal results are displayed)  Labs Reviewed  URINALYSIS COMPLETEWITH MICROSCOPIC ([facility] ONLY) - Abnormal; Notable for the following:    Color, Urine YELLOW (*)    APPearance HAZY (*)    Hgb urine dipstick 2+ (*)    Bacteria, UA RARE (*)    Squamous Epithelial / LPF 6-30 (*)    All other components within normal limits  BASIC METABOLIC PANEL - Abnormal; Notable for the following:    CO2 19 (*)    Calcium 8.8 (*)    All other components within normal limits  PREGNANCY, URINE  CBC WITH DIFFERENTIAL/PLATELET   ____________________________________________  EKG   ____________________________________________  RADIOLOGY   ____________________________________________   PROCEDURES  Procedure(s) performed: None  Critical Care performed:  No  ____________________________________________   INITIAL IMPRESSION / ASSESSMENT AND PLAN / ED COURSE  Pertinent labs & imaging results that were available during my care of the patient were reviewed by me and considered in my medical decision making (see chart for details).  Viral gastroenteritis. Discussed lab results. Patient given prescription for Zofran and Lomotil. Patient given a work note for 2 days. _Advised to [facility]. ___________________________________________   FINAL CLINICAL IMPRESSION(S) / ED DIAGNOSES  Final diagnoses:  Viral gastroenteritis      Joni Reining, PA-C 06/07/15 1332  Rockne Menghini, MD 06/07/15 1535

## 2015-06-07 NOTE — ED Notes (Signed)
Pt reports 24 hours of N/V/D

## 2015-06-28 ENCOUNTER — Emergency Department
Admission: EM | Admit: 2015-06-28 | Discharge: 2015-06-28 | Disposition: A | Discharge status: Home / Self Care | Payer: Medicaid Other | Attending: Emergency Medicine | Admitting: Emergency Medicine

## 2015-06-28 ENCOUNTER — Encounter: Payer: Self-pay | Admitting: Medical Oncology

## 2015-06-28 ENCOUNTER — Encounter: Payer: Self-pay | Admitting: Emergency Medicine

## 2015-06-28 ENCOUNTER — Emergency Department
Admission: EM | Admit: 2015-06-28 | Discharge: 2015-06-28 | Disposition: A | Discharge status: Home / Self Care | Payer: Medicaid Other | Source: Home / Self Care | Attending: Emergency Medicine | Admitting: Emergency Medicine

## 2015-06-28 DIAGNOSIS — F17210 Nicotine dependence, cigarettes, uncomplicated: Secondary | ICD-10-CM | POA: Insufficient documentation

## 2015-06-28 DIAGNOSIS — Z791 Long term (current) use of non-steroidal anti-inflammatories (NSAID): Secondary | ICD-10-CM | POA: Insufficient documentation

## 2015-06-28 DIAGNOSIS — G43909 Migraine, unspecified, not intractable, without status migrainosus: Secondary | ICD-10-CM | POA: Diagnosis not present

## 2015-06-28 DIAGNOSIS — Z5321 Procedure and treatment not carried out due to patient leaving prior to being seen by health care provider: Secondary | ICD-10-CM | POA: Insufficient documentation

## 2015-06-28 DIAGNOSIS — Z79899 Other long term (current) drug therapy: Secondary | ICD-10-CM | POA: Insufficient documentation

## 2015-06-28 DIAGNOSIS — R1030 Lower abdominal pain, unspecified: Secondary | ICD-10-CM | POA: Diagnosis present

## 2015-06-28 DIAGNOSIS — R1084 Generalized abdominal pain: Secondary | ICD-10-CM | POA: Insufficient documentation

## 2015-06-28 LAB — URINALYSIS COMPLETE WITH MICROSCOPIC (ARMC ONLY)
BILIRUBIN URINE: NEGATIVE
GLUCOSE, UA: NEGATIVE mg/dL
Ketones, ur: NEGATIVE mg/dL
Leukocytes, UA: NEGATIVE
Nitrite: NEGATIVE
Protein, ur: NEGATIVE mg/dL
SPECIFIC GRAVITY, URINE: 1.014 (ref 1.005–1.030)
pH: 7 (ref 5.0–8.0)

## 2015-06-28 LAB — COMPREHENSIVE METABOLIC PANEL
ALBUMIN: 4.2 g/dL (ref 3.5–5.0)
ALK PHOS: 54 U/L (ref 38–126)
ALT: 10 U/L — AB (ref 14–54)
ANION GAP: 5 (ref 5–15)
AST: 17 U/L (ref 15–41)
BUN: 9 mg/dL (ref 6–20)
CALCIUM: 8.7 mg/dL — AB (ref 8.9–10.3)
CO2: 21 mmol/L — AB (ref 22–32)
CREATININE: 0.86 mg/dL (ref 0.44–1.00)
Chloride: 110 mmol/L (ref 101–111)
GFR calc Af Amer: >60 mL/min (ref >60)
GFR calc non Af Amer: >60 mL/min (ref >60)
GLUCOSE: 101 mg/dL — AB (ref 65–99)
Potassium: 3.8 mmol/L (ref 3.5–5.1)
SODIUM: 136 mmol/L (ref 135–145)
Total Bilirubin: 0.6 mg/dL (ref 0.3–1.2)
Total Protein: 7.5 g/dL (ref 6.5–8.1)

## 2015-06-28 LAB — CBC
HCT: 36.3 % (ref 35.0–47.0)
HEMOGLOBIN: 12.2 g/dL (ref 12.0–16.0)
MCH: 27.7 pg (ref 26.0–34.0)
MCHC: 33.5 g/dL (ref 32.0–36.0)
MCV: 82.8 fL (ref 80.0–100.0)
Platelets: 308 10*3/uL (ref 150–440)
RBC: 4.39 MIL/uL (ref 3.80–5.20)
RDW: 13.8 % (ref 11.5–14.5)
WBC: 4.6 10*3/uL (ref 3.6–11.0)

## 2015-06-28 LAB — LIPASE, BLOOD: Lipase: 20 U/L (ref 11–51)

## 2015-06-28 LAB — POCT PREGNANCY, URINE: PREG TEST UR: NEGATIVE

## 2015-06-28 MED ORDER — TRAMADOL HCL 50 MG PO TABS: 50.0000 mg | ORAL_TABLET | Freq: Four times a day (QID) | ORAL | Status: DC | PRN

## 2015-06-28 NOTE — Discharge Instructions (Signed)

## 2015-06-28 NOTE — ED Notes (Signed)
Pt presents to ED with central lower abdominal pain for three days. Pt denies nausea, vomiting and diarrhea. Pt denies fever. Pt states has had some vaginal spotting. Pt states stopped taking depo injections in September but has not had a menstrual cycle yet. Pt states was here earlier but left without being seen. Pt had labs drawn in triage earlier.

## 2015-06-28 NOTE — ED Notes (Addendum)
Pt reports lower abd pain that began yesterday. States that she also had some vaginal spotting yesterday that lasted only a day and she hasnt had a period since September and she has missed her depo shot.  Denies nvd.

## 2015-06-28 NOTE — ED Notes (Signed)
Pt reports she has low abd pain.  No vag discharge.  No vag bleeding.  Pt states she was here this am but had to leave and just wants lab test results.  Pt states menses irregular.  Pt denies n/v/d.  Pt alert.

## 2015-06-29 NOTE — ED Provider Notes (Signed)
[facility] Emergency Department Provider Note  ____________________________________________    I have reviewed the triage vital signs and the nursing notes.   HISTORY  Chief Complaint Abdominal Pain    HPI Nancy Watson is a 35 y.o. female presents with abdominal pain. Patient reports she has had intermittent mild lower cramping abdominal pain for approximately 3 days. She denies nausea vomiting or diarrhea. She denies vaginal discharge to me. No fevers or chills. No dysuria. No history of similar pain.She was apparently in the emergency department this morning but left and now is back and asks for her lab results.     Past Medical History  Diagnosis Date  . Migraines   . Syncope     There are no active problems to display for this patient.   History reviewed. No pertinent past surgical history.  Current Outpatient Rx  Name  Route  Sig  Dispense  Refill  . cyclobenzaprine (FLEXERIL) 10 MG tablet   Oral   Take 1 tablet (10 mg total) by mouth every 8 (eight) hours as needed for muscle spasms. Patient not taking: Reported on 02/11/2015   15 tablet   0   . DENTAGEL 1.1 % GEL dental gel   Mouth/Throat   Use as directed 1 application in the mouth or throat at bedtime. Use a pea-sized amount           Dispense as written.   . dicyclomine (BENTYL) 20 MG tablet   Oral   Take 1 tablet (20 mg total) by mouth 3 (three) times daily as needed for spasms.   30 tablet   0   . famotidine (PEPCID) 20 MG tablet   Oral   Take 1 tablet (20 mg total) by mouth 2 (two) times daily.   60 tablet   1   . ibuprofen (ADVIL,MOTRIN) 800 MG tablet   Oral   Take 1 tablet (800 mg total) by mouth every 8 (eight) hours as needed for moderate pain. Patient not taking: Reported on 02/11/2015   15 tablet   0   . loperamide (IMODIUM A-D) 2 MG tablet   Oral   Take 1 tablet (2 mg total) by mouth 4 (four) times daily as needed for diarrhea or loose stools.   12  tablet   0   . meloxicam (MOBIC) 15 MG tablet   Oral   Take 1 tablet (15 mg total) by mouth daily. Patient not taking: Reported on 02/11/2015   30 tablet   0   . ondansetron (ZOFRAN) 4 MG tablet   Oral   Take 1 tablet (4 mg total) by mouth every 8 (eight) hours as needed for nausea or vomiting.   12 tablet   0   . SUMAtriptan (IMITREX) 25 MG tablet   Oral   Take 25 mg by mouth every 2 (two) hours as needed for migraine.          . traMADol (ULTRAM) 50 MG tablet   Oral   Take 1 tablet (50 mg total) by mouth every 6 (six) hours as needed.   20 tablet   0     Allergies Review of patient's allergies indicates no known allergies.  No family history on file.  Social History Social History  Substance Use Topics  . Smoking status: Current Every Day Smoker    Types: Cigarettes  . Smokeless tobacco: None  . Alcohol Use: No    Review of Systems  Constitutional: Negative for fever. Eyes:  Negative for redness ENT: Negative for sore throat Cardiovascular: Negative for chest pain Respiratory: Negative for shortness of breath. Gastrointestinal: As above Genitourinary: Negative for dysuria. Musculoskeletal: Negative for back pain. Skin: Negative for rash. Neurological: Negative for headache Psychiatric: no anxiety    ____________________________________________   PHYSICAL EXAM:  VITAL SIGNS: ED Triage Vitals  Enc Vitals Group     BP 06/28/15 1743 135/78 mmHg     Pulse Rate 06/28/15 1743 77     Resp 06/28/15 1743 18     Temp 06/28/15 1743 98.9 F (37.2 C)     Temp Source 06/28/15 1743 Oral     SpO2 06/28/15 1743 100 %     Weight 06/28/15 1743 140 lb (63.504 kg)     Height 06/28/15 1743  (1.676 m)     Head Cir --      Peak Flow --      Pain Score 06/28/15 1743 10     Pain Loc --      Pain Edu? --      Excl. in GC? --      Constitutional: Alert and oriented. Well appearing and in no distress.  Eyes: Conjunctivae are normal. No erythema or  injection ENT   Head: Normocephalic and atraumatic.   Mouth/Throat: Mucous membranes are moist. Cardiovascular: Normal rate, regular rhythm. Normal and symmetric distal pulses are present in the upper extremities.  Respiratory: Normal respiratory effort without tachypnea nor retractions. Breath sounds are clear and equal bilaterally.  Gastrointestinal: Soft and non-tender in all quadrants. No distention. There is no CVA tenderness. Genitourinary: deferredPer patient request Musculoskeletal: Nontender with normal range of motion in all extremities.  Neurologic:  Normal speech and language. No gross focal neurologic deficits are appreciated. Skin:  Skin is warm, dry and intact. No rash noted. Psychiatric: Mood and affect are normal. Patient exhibits appropriate insight and judgment.  ____________________________________________    LABS (pertinent positives/negatives)  Labs Reviewed - No data to display  ____________________________________________   EKG  None  ____________________________________________    RADIOLOGY  None  ____________________________________________   PROCEDURES  Procedure(s) performed: none  Critical Care performed: none  ____________________________________________   INITIAL IMPRESSION / ASSESSMENT AND PLAN / ED COURSE  Pertinent labs & imaging results that were available during my care of the patient were reviewed by me and considered in my medical decision making (see chart for details).  Patient well-appearing and in no distress. Lab work reviewed from this morning which was unremarkable. Pregnancy negative. Extremely benign exam. Patient does not want pelvic exam. She understands that this limits my diagnostic capability. Feel is appropriate to treat with analgesics and if pain is not improved after 24 hours she will return for further investigation.  ____________________________________________   FINAL CLINICAL IMPRESSION(S) / ED  DIAGNOSES  Final diagnoses:  Generalized abdominal pain          Jene Every, MD 06/29/15 [phone]

## 2015-07-05 ENCOUNTER — Emergency Department
Admission: EM | Admit: 2015-07-05 | Discharge: 2015-07-05 | Disposition: A | Discharge status: Home / Self Care | Payer: Medicaid Other | Attending: Emergency Medicine | Admitting: Emergency Medicine

## 2015-07-05 ENCOUNTER — Emergency Department: Payer: Medicaid Other

## 2015-07-05 DIAGNOSIS — Y998 Other external cause status: Secondary | ICD-10-CM | POA: Insufficient documentation

## 2015-07-05 DIAGNOSIS — Y92410 Unspecified street and highway as the place of occurrence of the external cause: Secondary | ICD-10-CM | POA: Diagnosis not present

## 2015-07-05 DIAGNOSIS — F17210 Nicotine dependence, cigarettes, uncomplicated: Secondary | ICD-10-CM | POA: Diagnosis not present

## 2015-07-05 DIAGNOSIS — M545 Low back pain, unspecified: Secondary | ICD-10-CM

## 2015-07-05 DIAGNOSIS — T148 Other injury of unspecified body region: Secondary | ICD-10-CM | POA: Diagnosis not present

## 2015-07-05 DIAGNOSIS — Y9389 Activity, other specified: Secondary | ICD-10-CM | POA: Insufficient documentation

## 2015-07-05 DIAGNOSIS — Z7952 Long term (current) use of systemic steroids: Secondary | ICD-10-CM | POA: Diagnosis not present

## 2015-07-05 DIAGNOSIS — R55 Syncope and collapse: Secondary | ICD-10-CM | POA: Insufficient documentation

## 2015-07-05 DIAGNOSIS — Z79899 Other long term (current) drug therapy: Secondary | ICD-10-CM | POA: Diagnosis not present

## 2015-07-05 LAB — POCT PREGNANCY, URINE: Preg Test, Ur: NEGATIVE

## 2015-07-05 MED ORDER — NAPROXEN 500 MG PO TABS: 500.0000 mg | ORAL_TABLET | Freq: Two times a day (BID) | ORAL | Status: DC

## 2015-07-05 MED ORDER — IBUPROFEN 600 MG PO TABS
600.0000 mg | ORAL_TABLET | Freq: Once | ORAL | Status: AC
  Administered 2015-07-05: 600 mg via ORAL
  Filled 2015-07-05: qty 1

## 2015-07-05 MED ORDER — CYCLOBENZAPRINE HCL 10 MG PO TABS
5.0000 mg | ORAL_TABLET | Freq: Once | ORAL | Status: AC
  Administered 2015-07-05: 5 mg via ORAL
  Filled 2015-07-05: qty 1

## 2015-07-05 MED ORDER — CYCLOBENZAPRINE HCL 5 MG PO TABS: 5.0000 mg | ORAL_TABLET | Freq: Three times a day (TID) | ORAL | Status: DC | PRN
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
## 2015-07-05 NOTE — Discharge Instructions (Signed)
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
## 2015-07-05 NOTE — ED Notes (Signed)
AAOx3.  Skin warm and dry.  NAD 

## 2015-07-07 ENCOUNTER — Telehealth: Payer: Self-pay | Admitting: Physician Assistant

## 2015-07-12 ENCOUNTER — Encounter: Payer: Self-pay | Admitting: *Deleted

## 2015-08-30 ENCOUNTER — Encounter: Payer: Self-pay | Admitting: Emergency Medicine

## 2015-08-30 ENCOUNTER — Emergency Department
Admission: EM | Admit: 2015-08-30 | Discharge: 2015-08-30 | Disposition: A | Discharge status: Home / Self Care | Payer: Medicaid Other | Attending: Emergency Medicine | Admitting: Emergency Medicine

## 2015-08-30 DIAGNOSIS — F17210 Nicotine dependence, cigarettes, uncomplicated: Secondary | ICD-10-CM | POA: Diagnosis not present

## 2015-08-30 DIAGNOSIS — R1030 Lower abdominal pain, unspecified: Secondary | ICD-10-CM | POA: Insufficient documentation

## 2015-08-30 DIAGNOSIS — Z79899 Other long term (current) drug therapy: Secondary | ICD-10-CM | POA: Insufficient documentation

## 2015-08-30 DIAGNOSIS — Z791 Long term (current) use of non-steroidal anti-inflammatories (NSAID): Secondary | ICD-10-CM | POA: Insufficient documentation

## 2015-08-30 LAB — COMPREHENSIVE METABOLIC PANEL
ALBUMIN: 4.2 g/dL (ref 3.5–5.0)
ALK PHOS: 58 U/L (ref 38–126)
ALT: 10 U/L — ABNORMAL LOW (ref 14–54)
ANION GAP: 6 (ref 5–15)
AST: 19 U/L (ref 15–41)
BILIRUBIN TOTAL: 0.2 mg/dL — AB (ref 0.3–1.2)
BUN: 10 mg/dL (ref 6–20)
CALCIUM: 8.7 mg/dL — AB (ref 8.9–10.3)
CO2: 22 mmol/L (ref 22–32)
Chloride: 109 mmol/L (ref 101–111)
Creatinine, Ser: 0.78 mg/dL (ref 0.44–1.00)
GFR calc non Af Amer: >60 mL/min (ref >60)
Glucose, Bld: 112 mg/dL — ABNORMAL HIGH (ref 65–99)
POTASSIUM: 4 mmol/L (ref 3.5–5.1)
Sodium: 137 mmol/L (ref 135–145)
TOTAL PROTEIN: 7.6 g/dL (ref 6.5–8.1)

## 2015-08-30 LAB — CBC
HEMATOCRIT: 35.9 % (ref 35.0–47.0)
HEMOGLOBIN: 12.3 g/dL (ref 12.0–16.0)
MCH: 28.6 pg (ref 26.0–34.0)
MCHC: 34.2 g/dL (ref 32.0–36.0)
MCV: 83.4 fL (ref 80.0–100.0)
Platelets: 310 10*3/uL (ref 150–440)
RBC: 4.3 MIL/uL (ref 3.80–5.20)
RDW: 14.2 % (ref 11.5–14.5)
WBC: 4.5 10*3/uL (ref 3.6–11.0)

## 2015-08-30 LAB — URINALYSIS COMPLETE WITH MICROSCOPIC (ARMC ONLY)
Bilirubin Urine: NEGATIVE
GLUCOSE, UA: NEGATIVE mg/dL
Ketones, ur: NEGATIVE mg/dL
LEUKOCYTES UA: NEGATIVE
NITRITE: NEGATIVE
PH: 7 (ref 5.0–8.0)
PROTEIN: NEGATIVE mg/dL
SPECIFIC GRAVITY, URINE: 1.012 (ref 1.005–1.030)

## 2015-08-30 LAB — WET PREP, GENITAL
Clue Cells Wet Prep HPF POC: NONE SEEN
SPERM: NONE SEEN
Trich, Wet Prep: NONE SEEN
WBC WET PREP: NONE SEEN
YEAST WET PREP: NONE SEEN

## 2015-08-30 LAB — CHLAMYDIA/NGC RT PCR (ARMC ONLY)
Chlamydia Tr: NOT DETECTED
N GONORRHOEAE: NOT DETECTED

## 2015-08-30 LAB — HCG, QUANTITATIVE, PREGNANCY

## 2015-08-30 LAB — POCT PREGNANCY, URINE: PREG TEST UR: NEGATIVE

## 2015-08-30 NOTE — ED Provider Notes (Signed)
[facility] Emergency Department Provider Note   ____________________________________________  Time seen: Approximately 2:28 PM  I have reviewed the triage vital signs and the nursing notes.   HISTORY  Chief Complaint Abdominal Pain   HPI Nancy Watson is a 35 y.o. female who reports her menstrual period is late. She had a positive prexy test at home. She has crampy lower abdominal pain. Came into the ER to be checked. Here the pregnancy test both urine and blood were negative. She reports the pain is mild to moderate been going on for day or 2. Basically suprapubic crampy pain. No nausea vomiting diarrhea no fever no chills no coughing. Nothing really makes it better or worse.   Past Medical History  Diagnosis Date  . Migraines   . Syncope     There are no active problems to display for this patient.   History reviewed. No pertinent past surgical history.  Current Outpatient Rx  Name  Route  Sig  Dispense  Refill  . cyclobenzaprine (FLEXERIL) 5 MG tablet   Oral   Take 1 tablet (5 mg total) by mouth 3 (three) times daily as needed for muscle spasms.   30 tablet   0   . DENTAGEL 1.1 % GEL dental gel   Mouth/Throat   Use as directed 1 application in the mouth or throat at bedtime. Use a pea-sized amount           Dispense as written.   . dicyclomine (BENTYL) 20 MG tablet   Oral   Take 1 tablet (20 mg total) by mouth 3 (three) times daily as needed for spasms.   30 tablet   0   . famotidine (PEPCID) 20 MG tablet   Oral   Take 1 tablet (20 mg total) by mouth 2 (two) times daily.   60 tablet   1   . ibuprofen (ADVIL,MOTRIN) 800 MG tablet   Oral   Take 1 tablet (800 mg total) by mouth every 8 (eight) hours as needed for moderate pain. Patient not taking: Reported on 02/11/2015   15 tablet   0   . loperamide (IMODIUM A-D) 2 MG tablet   Oral   Take 1 tablet (2 mg total) by mouth 4 (four) times daily as needed for diarrhea or  loose stools.   12 tablet   0   . meloxicam (MOBIC) 15 MG tablet   Oral   Take 1 tablet (15 mg total) by mouth daily. Patient not taking: Reported on 02/11/2015   30 tablet   0   . naproxen (NAPROSYN) 500 MG tablet   Oral   Take 1 tablet (500 mg total) by mouth 2 (two) times daily with a meal.   30 tablet   0   . ondansetron (ZOFRAN) 4 MG tablet   Oral   Take 1 tablet (4 mg total) by mouth every 8 (eight) hours as needed for nausea or vomiting.   12 tablet   0   . SUMAtriptan (IMITREX) 25 MG tablet   Oral   Take 25 mg by mouth every 2 (two) hours as needed for migraine.          . traMADol (ULTRAM) 50 MG tablet   Oral   Take 1 tablet (50 mg total) by mouth every 6 (six) hours as needed.   20 tablet   0     Allergies Review of patient's allergies indicates no known allergies.  History reviewed. No pertinent family  history.  Social History Social History  Substance Use Topics  . Smoking status: Current Every Day Smoker    Types: Cigarettes  . Smokeless tobacco: None  . Alcohol Use: No    Review of Systems Constitutional: No fever/chills Eyes: No visual changes. ENT: No sore throat. Cardiovascular: Denies chest pain. Respiratory: Denies shortness of breath. Gastrointestinal:See history of present illness Genitourinary: Negative for dysuria. Musculoskeletal: Negative for back pain. Skin: Negative for rash. Neurological: Negative for headaches, focal weakness or numbness.  10-point ROS otherwise negative.  ____________________________________________   PHYSICAL EXAM:  VITAL SIGNS: ED Triage Vitals  Enc Vitals Group     BP 08/30/15 1132 121/78 mmHg     Pulse Rate 08/30/15 1132 79     Resp 08/30/15 1132 20     Temp 08/30/15 1132 98.2 F (36.8 C)     Temp Source 08/30/15 1132 Oral     SpO2 08/30/15 1132 100 %     Weight 08/30/15 1132 154 lb (69.854 kg)     Height 08/30/15 1132 5\' 6"  (1.676 m)     Head Cir --      Peak Flow --      Pain Score  08/30/15 1133 10     Pain Loc --      Pain Edu? --      Excl. in GC? --     Constitutional: Alert and oriented. Well appearing and in no acute distress. Eyes: Conjunctivae are normal. PERRL. EOMI. Head: Atraumatic. Nose: No congestion/rhinnorhea. Mouth/Throat: Mucous membranes are moist.  Oropharynx non-erythematous. Neck: No stridor.  Cardiovascular: Normal rate, regular rhythm. Grossly normal heart sounds.  Good peripheral circulation. Respiratory: Normal respiratory effort.  No retractions. Lungs CTAB. Gastrointestinal: Soft and nontender. No distention. No abdominal bruits. No CVA tenderness. Genitourinary: Normal perineum and vagina. No adnexal masses or tenderness. There is no cervical motion tenderness. When I touch her cervix she says that's where the cramping is. Again it is not tender to touch it. Musculoskeletal: No lower extremity tenderness nor edema.  No joint effusions. Neurologic:  Normal speech and language. No gross focal neurologic deficits are appreciated. No gait instability. Skin:  Skin is warm, dry and intact. No rash noted. Psychiatric: Mood and affect are normal. Speech and behavior are normal.  ____________________________________________   LABS (all labs ordered are listed, but only abnormal results are displayed)  Labs Reviewed  COMPREHENSIVE METABOLIC PANEL - Abnormal; Notable for the following:    Glucose, Bld 112 (*)    Calcium 8.7 (*)    ALT 10 (*)    Total Bilirubin 0.2 (*)    All other components within normal limits  URINALYSIS COMPLETEWITH MICROSCOPIC ([facility] ONLY) - Abnormal; Notable for the following:    Color, Urine YELLOW (*)    APPearance CLEAR (*)    Hgb urine dipstick 1+ (*)    Bacteria, UA RARE (*)    Squamous Epithelial / LPF 0-5 (*)    All other components within normal limits  CHLAMYDIA/NGC RT PCR ([facility] ONLY)  WET PREP, GENITAL  CBC  HCG, QUANTITATIVE, PREGNANCY  POCT PREGNANCY, URINE    ____________________________________________  EKG   ____________________________________________  RADIOLOGY Patient does not want to wait for the ultrasound. The wait would be about 45 minutes. The patient really does not have an appointment she just is tired of waiting. I cannot schedule an outpatient ultrasound. However [facility]. ____________________________________________   PROCEDURES    ____________________________________________  INITIAL IMPRESSION / ASSESSMENT AND PLAN / ED COURSE  Pertinent labs & imaging results that were available during my care of the patient were reviewed by me and considered in my medical decision making (see chart for details).   ____________________________________________   FINAL CLINICAL IMPRESSION(S) / ED DIAGNOSES  Final diagnoses:  Lower abdominal pain      NEW MEDICATIONS STARTED DURING THIS VISIT:  New Prescriptions   No medications on file     Note:  This document was prepared using Dragon voice recognition software and may include unintentional dictation errors.    Arnaldo Natal, MD 08/30/15 [id number]

## 2015-08-30 NOTE — Discharge Instructions (Signed)
Abdominal Pain, Adult Many things can cause abdominal pain. Usually, abdominal pain is not caused by a disease and will improve without treatment. It can often be observed and treated at home. Your health care provider will do a physical exam and possibly order blood tests and X-rays to help determine the seriousness of your pain. However, in many cases, more time must pass before a clear cause of the pain can be found. Before that point, your health care provider may not know if you need more testing or further treatment. HOME CARE INSTRUCTIONS Monitor your abdominal pain for any changes. The following actions may help to alleviate any discomfort you are experiencing:  Only take over-the-counter or prescription medicines as directed by your health care provider.  Do not take laxatives unless directed to do so by your health care provider.  Try a clear liquid diet (broth, tea, or water) as directed by your health care provider. Slowly move to a bland diet as tolerated. SEEK MEDICAL CARE IF:  You have unexplained abdominal pain.  You have abdominal pain associated with nausea or diarrhea.  You have pain when you urinate or have a bowel movement.  You experience abdominal pain that wakes you in the night.  You have abdominal pain that is worsened or improved by eating food.  You have abdominal pain that is worsened with eating fatty foods.  You have a fever. SEEK IMMEDIATE MEDICAL CARE IF:  Your pain does not go away within 2 hours.  You keep throwing up (vomiting).  Your pain is felt only in portions of the abdomen, such as the right side or the left lower portion of the abdomen.  You pass bloody or black tarry stools. MAKE SURE YOU:  Understand these instructions.  Will watch your condition.  Will get help right away if you are not doing well or get worse.   This information is not intended to replace advice given to you by your health care provider. Make sure you discuss  any questions you have with your health care provider.   Document Released: 12/28/2004 Document Revised: 12/09/2014 Document Reviewed: 11/27/2012 Elsevier Interactive Patient Education [employer]2016 [employer]   Please return for worse pain fever vomiting. Please follow-up with either [facility] Dr[facility] at [facility] or you could try calling [facility] since they delivered her child.  Name they be able to follow-up with you. You can try Motrin for the cramps. That often will work for pelvic cramping.

## 2015-08-30 NOTE — ED Notes (Signed)
Pt to ed with c/o abd pain and positive pregnancy test.  Pt reports lmp April 17th

## 2015-08-30 NOTE — ED Notes (Signed)
Patient refused to stay for US of pelvic area. Patient states "I just want to go home and lay down"

## 2015-09-02 HISTORY — PX: DILATION AND CURETTAGE OF UTERUS: SHX78

## 2015-09-21 ENCOUNTER — Emergency Department
Admission: EM | Admit: 2015-09-21 | Discharge: 2015-09-21 | Disposition: A | Discharge status: Home / Self Care | Payer: Medicaid Other | Attending: Emergency Medicine | Admitting: Emergency Medicine

## 2015-09-21 ENCOUNTER — Emergency Department: Payer: Medicaid Other

## 2015-09-21 ENCOUNTER — Encounter: Payer: Self-pay | Admitting: Emergency Medicine

## 2015-09-21 DIAGNOSIS — R102 Pelvic and perineal pain: Secondary | ICD-10-CM | POA: Insufficient documentation

## 2015-09-21 DIAGNOSIS — Z791 Long term (current) use of non-steroidal anti-inflammatories (NSAID): Secondary | ICD-10-CM | POA: Diagnosis not present

## 2015-09-21 DIAGNOSIS — Z79899 Other long term (current) drug therapy: Secondary | ICD-10-CM | POA: Insufficient documentation

## 2015-09-21 DIAGNOSIS — F17210 Nicotine dependence, cigarettes, uncomplicated: Secondary | ICD-10-CM | POA: Insufficient documentation

## 2015-09-21 DIAGNOSIS — O26899 Other specified pregnancy related conditions, unspecified trimester: Secondary | ICD-10-CM | POA: Insufficient documentation

## 2015-09-21 DIAGNOSIS — R1030 Lower abdominal pain, unspecified: Secondary | ICD-10-CM | POA: Diagnosis present

## 2015-09-21 DIAGNOSIS — O3680X0 Pregnancy with inconclusive fetal viability, not applicable or unspecified: Secondary | ICD-10-CM

## 2015-09-21 LAB — CBC
HEMATOCRIT: 38.1 % (ref 35.0–47.0)
HEMOGLOBIN: 12.8 g/dL (ref 12.0–16.0)
MCH: 28.5 pg (ref 26.0–34.0)
MCHC: 33.7 g/dL (ref 32.0–36.0)
MCV: 84.5 fL (ref 80.0–100.0)
Platelets: 287 10*3/uL (ref 150–440)
RBC: 4.51 MIL/uL (ref 3.80–5.20)
RDW: 14 % (ref 11.5–14.5)
WBC: 6.1 10*3/uL (ref 3.6–11.0)

## 2015-09-21 LAB — URINALYSIS COMPLETE WITH MICROSCOPIC (ARMC ONLY)
Bilirubin Urine: NEGATIVE
Glucose, UA: NEGATIVE mg/dL
Hgb urine dipstick: NEGATIVE
KETONES UR: NEGATIVE mg/dL
Leukocytes, UA: NEGATIVE
Nitrite: NEGATIVE
PH: 6 (ref 5.0–8.0)
PROTEIN: NEGATIVE mg/dL
Specific Gravity, Urine: 1.021 (ref 1.005–1.030)

## 2015-09-21 LAB — COMPREHENSIVE METABOLIC PANEL
ALBUMIN: 4.4 g/dL (ref 3.5–5.0)
ALK PHOS: 68 U/L (ref 38–126)
ALT: 9 U/L — ABNORMAL LOW (ref 14–54)
ANION GAP: 8 (ref 5–15)
AST: 19 U/L (ref 15–41)
BUN: 10 mg/dL (ref 6–20)
CHLORIDE: 105 mmol/L (ref 101–111)
CO2: 21 mmol/L — AB (ref 22–32)
Calcium: 8.9 mg/dL (ref 8.9–10.3)
Creatinine, Ser: 0.84 mg/dL (ref 0.44–1.00)
GFR calc non Af Amer: >60 mL/min (ref >60)
GLUCOSE: 97 mg/dL (ref 65–99)
Potassium: 3.5 mmol/L (ref 3.5–5.1)
SODIUM: 134 mmol/L — AB (ref 135–145)
Total Bilirubin: 0.2 mg/dL — ABNORMAL LOW (ref 0.3–1.2)
Total Protein: 7.9 g/dL (ref 6.5–8.1)

## 2015-09-21 LAB — POCT PREGNANCY, URINE: PREG TEST UR: POSITIVE — AB

## 2015-09-21 LAB — ABO/RH: ABO/RH(D): O POS

## 2015-09-21 LAB — HCG, QUANTITATIVE, PREGNANCY: hCG, Beta Chain, Quant, S: 97 m[IU]/mL — ABNORMAL HIGH (ref <5)

## 2015-09-21 LAB — LIPASE, BLOOD: LIPASE: 28 U/L (ref 11–51)

## 2015-09-21 NOTE — ED Provider Notes (Signed)
[facility] Emergency Department Provider Note    ____________________________________________  Time seen: ~2035  I have reviewed the triage vital signs and the nursing notes.   HISTORY  Chief Complaint Abdominal Pain   History limited by: Not Limited   HPI Nancy Watson is a 35 y.o. female who presents to the emergency department today because of abdominal pain. The pain is located in the suprapubic region. It is described as cramping. It is been going on for the past 2-3 days. It is intermittent. Patient denies any associated nausea or vomiting. No fevers. No dysuria or bad odor to the urine. No change in defecation. No vaginal bleeding or discharge.    Past Medical History  Diagnosis Date  . Migraines   . Syncope     There are no active problems to display for this patient.   History reviewed. No pertinent past surgical history.  Current Outpatient Rx  Name  Route  Sig  Dispense  Refill  . cyclobenzaprine (FLEXERIL) 5 MG tablet   Oral   Take 1 tablet (5 mg total) by mouth 3 (three) times daily as needed for muscle spasms.   30 tablet   0   . DENTAGEL 1.1 % GEL dental gel   Mouth/Throat   Use as directed 1 application in the mouth or throat at bedtime. Use a pea-sized amount           Dispense as written.   . dicyclomine (BENTYL) 20 MG tablet   Oral   Take 1 tablet (20 mg total) by mouth 3 (three) times daily as needed for spasms.   30 tablet   0   . famotidine (PEPCID) 20 MG tablet   Oral   Take 1 tablet (20 mg total) by mouth 2 (two) times daily.   60 tablet   1   . ibuprofen (ADVIL,MOTRIN) 800 MG tablet   Oral   Take 1 tablet (800 mg total) by mouth every 8 (eight) hours as needed for moderate pain. Patient not taking: Reported on 02/11/2015   15 tablet   0   . loperamide (IMODIUM A-D) 2 MG tablet   Oral   Take 1 tablet (2 mg total) by mouth 4 (four) times daily as needed for diarrhea or loose stools.   12  tablet   0   . meloxicam (MOBIC) 15 MG tablet   Oral   Take 1 tablet (15 mg total) by mouth daily. Patient not taking: Reported on 02/11/2015   30 tablet   0   . naproxen (NAPROSYN) 500 MG tablet   Oral   Take 1 tablet (500 mg total) by mouth 2 (two) times daily with a meal.   30 tablet   0   . ondansetron (ZOFRAN) 4 MG tablet   Oral   Take 1 tablet (4 mg total) by mouth every 8 (eight) hours as needed for nausea or vomiting.   12 tablet   0   . SUMAtriptan (IMITREX) 25 MG tablet   Oral   Take 25 mg by mouth every 2 (two) hours as needed for migraine.          . traMADol (ULTRAM) 50 MG tablet   Oral   Take 1 tablet (50 mg total) by mouth every 6 (six) hours as needed.   20 tablet   0     Allergies Review of patient's allergies indicates no known allergies.  No family history on file.  Social History Social History  Substance Use Topics  . Smoking status: Current Every Day Smoker    Types: Cigarettes  . Smokeless tobacco: None  . Alcohol Use: No    Review of Systems  Constitutional: Negative for fever. Cardiovascular: Negative for chest pain. Respiratory: Negative for shortness of breath. Gastrointestinal: Positive for lower abdominal pain. Genitourinary: Negative for dysuria. Musculoskeletal: Negative for back pain. Skin: Negative for rash. Neurological: Negative for headaches, focal weakness or numbness.  10-point ROS otherwise negative.  ____________________________________________   PHYSICAL EXAM:  VITAL SIGNS: ED Triage Vitals  Enc Vitals Group     BP 09/21/15 1937 90/75 mmHg     Pulse Rate 09/21/15 1934 87     Resp 09/21/15 1934 16     Temp 09/21/15 1934 98.4 F (36.9 C)     Temp Source 09/21/15 1934 Oral     SpO2 09/21/15 1934 100 %     Weight 09/21/15 1934 145 lb (65.772 kg)     Height 09/21/15 1934  (1.676 m)     Head Cir --      Peak Flow --      Pain Score 09/21/15 1936 9   Constitutional: Alert and oriented. Well  appearing and in no distress. Eyes: Conjunctivae are normal. PERRL. Normal extraocular movements. ENT   Head: Normocephalic and atraumatic.   Nose: No congestion/rhinnorhea.   Mouth/Throat: Mucous membranes are moist.   Neck: No stridor. Hematological/Lymphatic/Immunilogical: No cervical lymphadenopathy. Cardiovascular: Normal rate, regular rhythm.  No murmurs, rubs, or gallops. Respiratory: Normal respiratory effort without tachypnea nor retractions. Breath sounds are clear and equal bilaterally. No wheezes/rales/rhonchi. Gastrointestinal: Soft and nontender. No distention. There is no CVA tenderness. Genitourinary: Deferred Musculoskeletal: Normal range of motion in all extremities. No joint effusions.  No lower extremity tenderness nor edema. Neurologic:  Normal speech and language. No gross focal neurologic deficits are appreciated.  Skin:  Skin is warm, dry and intact. No rash noted. Psychiatric: Mood and affect are normal. Speech and behavior are normal. Patient exhibits appropriate insight and judgment.  ____________________________________________    LABS (pertinent positives/negatives)  Labs Reviewed  COMPREHENSIVE METABOLIC PANEL - Abnormal; Notable for the following:    Sodium 134 (*)    CO2 21 (*)    ALT 9 (*)    Total Bilirubin 0.2 (*)    All other components within normal limits  URINALYSIS COMPLETEWITH MICROSCOPIC ([facility] ONLY) - Abnormal; Notable for the following:    Color, Urine YELLOW (*)    APPearance CLEAR (*)    Bacteria, UA RARE (*)    Squamous Epithelial / LPF 0-5 (*)    All other components within normal limits  HCG, QUANTITATIVE, PREGNANCY - Abnormal; Notable for the following:    hCG, Beta Chain, Quant, S 97 (*)    All other components within normal limits  POCT PREGNANCY, URINE - Abnormal; Notable for the following:    Preg Test, Ur POSITIVE (*)    All other components within normal limits  LIPASE, BLOOD  CBC  POC URINE PREG, ED   ABO/RH     ____________________________________________   EKG  None  ____________________________________________    RADIOLOGY  US IMPRESSION: 1. No intrauterine gestation. 2. Ovoid solid mass measuring 2.8 cm superior to the RIGHT ovary with vascularity. Cannot exclude ECTOPIC PREGNANCY. 3. Large cystic lesion appears to originate within the RIGHT ovary measuring up to 7 cm.  I, Phineas Semen, personally discussed these images and results by phone with the on-call radiologist and used this discussion as  part of my medical decision making.   ____________________________________________   PROCEDURES  Procedure(s) performed: None  Critical Care performed: No  ____________________________________________   INITIAL IMPRESSION / ASSESSMENT AND PLAN / ED COURSE  Pertinent labs & imaging results that were available during my care of the patient were reviewed by me and considered in my medical decision making (see chart for details).  He presented to the emergency department today because of concerns for pelvic pain and abdominal cramping. Patient had a positive urine pregnancy test here. There is some question of a positive urinary test roughly 3 weeks ago however when she was seen in the emergency department bloody and was negative. Ultrasound was performed which showed a soft mass measuring roughly 2.8 cm. To the right ovary. She is concerned for ectopic. In addition there is a 7 cm cystic lesion. Will discuss with OB/GYN.  ----------------------------------------- 10:59 PM on 09/21/2015 -----------------------------------------  Discussed with Dr. Dalbert Garnet with Ob/gyn who reviewed the ultrasound images. At this point unclear if mass represents ectopic pregnancy. Given that the patient does not have any bleeding, does not have any abdominal tenderness and is not in extremis felt that she could be a candidate for outpatient follow up. I had a discussion about this with  the patient. She states she lives 10 minutes away from the hospital. She does have children in the house, the oldest who is 8, plus she has a neighbor who lives across the street who can stay with her overnight. She did state that she wanted to go home. I did discuss with the patient and explained return precautions for ectopic rupture. Also explained severity of ectopic rupture. Furthermore I did offer the patient admission at this time. Patient again stated she would like to go home. Given that she does live close to the hospital and does have an adult who can stay with her overnight and does understand return precautions I feel it is reasonable at this time to let patient follow-up as an outpatient. Dr. Dalbert Garnet with OB/GYN will put in an order for a repeat beta Quant in 24 hours. Additionally she will see the patient in clinic on Friday. I discussed the plan with the patient. Patient is comfortable with the plan at this time. ____________________________________________   FINAL CLINICAL IMPRESSION(S) / ED DIAGNOSES  Final diagnoses:  Pelvic pain in female     Note: This dictation was prepared with Dragon dictation. Any transcriptional errors that result from this process are unintentional    Phineas Semen, MD 09/21/15 [phone]

## 2015-09-21 NOTE — Discharge Instructions (Signed)
As we discussed it is extremely important that he return for any vaginal bleeding, change in pain or change in quality of pain. This could signify a ruptured ectopic pregnancy. Please seek medical attention for any high fevers, chest pain, shortness of breath, change in behavior, persistent vomiting, bloody stool or any other new or concerning symptoms.   First Trimester of Pregnancy The first trimester of pregnancy is from week 1 until the end of week 12 (months 1 through 3). During this time, your baby will begin to develop inside you. At 6-8 weeks, the eyes and face are formed, and the heartbeat can be seen on ultrasound. At the end of 12 weeks, all the baby's organs are formed. Prenatal care is all the medical care you receive before the birth of your baby. Make sure you get good prenatal care and follow all of your doctor's instructions. HOME CARE  Medicines  Take medicine only as told by your doctor. Some medicines are safe and some are not during pregnancy.  Take your prenatal vitamins as told by your doctor.  Take medicine that helps you poop (stool softener) as needed if your doctor says it is okay. Diet  Eat regular, healthy meals.  Your doctor will tell you the amount of weight gain that is right for you.  Avoid raw meat and uncooked cheese.  If you feel sick to your stomach (nauseous) or throw up (vomit):  Eat 4 or 5 small meals a day instead of 3 large meals.  Try eating a few soda crackers.  Drink liquids between meals instead of during meals.  If you have a hard time pooping (constipation):  Eat high-fiber foods like fresh vegetables, fruit, and whole grains.  Drink enough fluids to keep your pee (urine) clear or pale yellow. Activity and Exercise  Exercise only as told by your doctor. Stop exercising if you have cramps or pain in your lower belly (abdomen) or low back.  Try to avoid standing for long periods of time. Move your legs often if you must stand in one  place for a long time.  Avoid heavy lifting.  Wear low-heeled shoes. Sit and stand up straight.  You can have sex unless your doctor tells you not to. Relief of Pain or Discomfort  Wear a good support bra if your breasts are sore.  Take warm water baths (sitz baths) to soothe pain or discomfort caused by hemorrhoids. Use hemorrhoid cream if your doctor says it is okay.  Rest with your legs raised if you have leg cramps or low back pain.  Wear support hose if you have puffy, bulging veins (varicose veins) in your legs. Raise (elevate) your feet for 15 minutes, 3-4 times a day. Limit salt in your diet. Prenatal Care  Schedule your prenatal visits by the twelfth week of pregnancy.  Write down your questions. Take them to your prenatal visits.  Keep all your prenatal visits as told by your doctor. Safety  Wear your seat belt at all times when driving.  Make a list of emergency phone numbers. The list should include numbers for family, friends, the hospital, and police and fire departments. General Tips  Ask your doctor for a referral to a local prenatal class. Begin classes no later than at the start of month 6 of your pregnancy.  Ask for help if you need counseling or help with nutrition. Your doctor can give you advice or tell you where to go for help.  Do not use hot  tubs, steam rooms, or saunas.  Do not douche or use tampons or scented sanitary pads.  Do not cross your legs for long periods of time.  Avoid litter boxes and soil used by cats.  Avoid all smoking, herbs, and alcohol. Avoid drugs not approved by your doctor.  Do not use any tobacco products, including cigarettes, chewing tobacco, and electronic cigarettes. If you need help quitting, ask your doctor. You may get counseling or other support to help you quit.  Visit your dentist. At home, brush your teeth with a soft toothbrush. Be gentle when you floss. GET HELP IF:  You are dizzy.  You have mild cramps  or pressure in your lower belly.  You have a nagging pain in your belly area.  You continue to feel sick to your stomach, throw up, or have watery poop (diarrhea).  You have a bad smelling fluid coming from your vagina.  You have pain with peeing (urination).  You have increased puffiness (swelling) in your face, hands, legs, or ankles. GET HELP RIGHT AWAY IF:   You have a fever.  You are leaking fluid from your vagina.  You have spotting or bleeding from your vagina.  You have very bad belly cramping or pain.  You gain or lose weight rapidly.  You throw up blood. It may look like coffee grounds.  You are around people who have [place] measles, fifth disease, or chickenpox.  You have a very bad headache.  You have shortness of breath.  You have any kind of trauma, such as from a fall or a car accident.   This information is not intended to replace advice given to you by your health care provider. Make sure you discuss any questions you have with your health care provider.   Document Released: 09/06/2007 Document Revised: 04/10/2014 Document Reviewed: 01/28/2013 Elsevier Interactive Patient Education [employer]2016 [employer] Inc.

## 2015-09-21 NOTE — ED Notes (Addendum)
C/O stomach cramping and top of legs hurting.  Symptoms began approximately 3 days ago.  Denies N/V, dysuria.  Seen in ED recently for same complaint.  Followed up with PCP for same as well.  Patient states all tests have come back normal.

## 2015-09-23 ENCOUNTER — Other Ambulatory Visit
Admission: RE | Admit: 2015-09-23 | Discharge: 2015-09-23 | Disposition: A | Discharge status: Home / Self Care | Payer: Medicaid Other | Source: Ambulatory Visit | Attending: Obstetrics and Gynecology | Admitting: Obstetrics and Gynecology

## 2015-09-23 ENCOUNTER — Other Ambulatory Visit: Payer: Self-pay | Admitting: Obstetrics and Gynecology

## 2015-09-23 DIAGNOSIS — Z3491 Encounter for supervision of normal pregnancy, unspecified, first trimester: Secondary | ICD-10-CM | POA: Insufficient documentation

## 2015-09-23 DIAGNOSIS — O3680X0 Pregnancy with inconclusive fetal viability, not applicable or unspecified: Secondary | ICD-10-CM

## 2015-09-23 LAB — HCG, QUANTITATIVE, PREGNANCY: HCG, BETA CHAIN, QUANT, S: 228 m[IU]/mL — AB (ref <5)

## 2015-09-28 ENCOUNTER — Other Ambulatory Visit
Admission: RE | Admit: 2015-09-28 | Discharge: 2015-09-28 | Disposition: A | Discharge status: Home / Self Care | Payer: Medicaid Other | Source: Ambulatory Visit | Attending: Obstetrics and Gynecology | Admitting: Obstetrics and Gynecology

## 2015-09-28 DIAGNOSIS — O3680X0 Pregnancy with inconclusive fetal viability, not applicable or unspecified: Secondary | ICD-10-CM

## 2015-09-28 DIAGNOSIS — Z331 Pregnant state, incidental: Secondary | ICD-10-CM | POA: Insufficient documentation

## 2015-09-28 LAB — HCG, QUANTITATIVE, PREGNANCY: HCG, BETA CHAIN, QUANT, S: 2106 m[IU]/mL — AB (ref <5)

## 2015-10-03 ENCOUNTER — Observation Stay: Payer: Medicaid Other | Admitting: Anesthesiology

## 2015-10-03 ENCOUNTER — Observation Stay
Admission: EM | Admit: 2015-10-03 | Discharge: 2015-10-04 | Disposition: A | Discharge status: Home / Self Care | Payer: Medicaid Other | Attending: Obstetrics and Gynecology | Admitting: Obstetrics and Gynecology

## 2015-10-03 ENCOUNTER — Emergency Department: Payer: Medicaid Other

## 2015-10-03 ENCOUNTER — Encounter: Payer: Self-pay | Admitting: Emergency Medicine

## 2015-10-03 ENCOUNTER — Encounter
Admission: EM | Disposition: A | Discharge status: Home / Self Care | Payer: Self-pay | Source: Home / Self Care | Attending: Emergency Medicine

## 2015-10-03 DIAGNOSIS — F17210 Nicotine dependence, cigarettes, uncomplicated: Secondary | ICD-10-CM | POA: Insufficient documentation

## 2015-10-03 DIAGNOSIS — Z79899 Other long term (current) drug therapy: Secondary | ICD-10-CM | POA: Diagnosis not present

## 2015-10-03 DIAGNOSIS — N83201 Unspecified ovarian cyst, right side: Secondary | ICD-10-CM | POA: Diagnosis present

## 2015-10-03 DIAGNOSIS — Z9889 Other specified postprocedural states: Secondary | ICD-10-CM

## 2015-10-03 DIAGNOSIS — N8311 Corpus luteum cyst of right ovary: Secondary | ICD-10-CM | POA: Diagnosis not present

## 2015-10-03 DIAGNOSIS — O009 Unspecified ectopic pregnancy without intrauterine pregnancy: Secondary | ICD-10-CM

## 2015-10-03 DIAGNOSIS — N8301 Follicular cyst of right ovary: Principal | ICD-10-CM | POA: Insufficient documentation

## 2015-10-03 DIAGNOSIS — N83209 Unspecified ovarian cyst, unspecified side: Secondary | ICD-10-CM | POA: Diagnosis present

## 2015-10-03 HISTORY — PX: LAPAROSCOPIC SALPINGO OOPHERECTOMY: SHX5927

## 2015-10-03 LAB — CBC WITH DIFFERENTIAL/PLATELET
BASOS ABS: 0 10*3/uL (ref 0–0.1)
Basophils Relative: 1 %
EOS ABS: 0.2 10*3/uL (ref 0–0.7)
EOS PCT: 4 %
HCT: 33.1 % — ABNORMAL LOW (ref 35.0–47.0)
Hemoglobin: 11.4 g/dL — ABNORMAL LOW (ref 12.0–16.0)
LYMPHS PCT: 44 %
Lymphs Abs: 2.1 10*3/uL (ref 1.0–3.6)
MCH: 28.8 pg (ref 26.0–34.0)
MCHC: 34.5 g/dL (ref 32.0–36.0)
MCV: 83.4 fL (ref 80.0–100.0)
Monocytes Absolute: 0.4 10*3/uL (ref 0.2–0.9)
Monocytes Relative: 9 %
Neutro Abs: 2 10*3/uL (ref 1.4–6.5)
Neutrophils Relative %: 42 %
PLATELETS: 282 10*3/uL (ref 150–440)
RBC: 3.97 MIL/uL (ref 3.80–5.20)
RDW: 13.3 % (ref 11.5–14.5)
WBC: 4.8 10*3/uL (ref 3.6–11.0)

## 2015-10-03 LAB — COMPREHENSIVE METABOLIC PANEL
ALT: 8 U/L — ABNORMAL LOW (ref 14–54)
ANION GAP: 4 — AB (ref 5–15)
AST: 14 U/L — AB (ref 15–41)
Albumin: 3.7 g/dL (ref 3.5–5.0)
Alkaline Phosphatase: 46 U/L (ref 38–126)
BILIRUBIN TOTAL: 0.4 mg/dL (ref 0.3–1.2)
BUN: 10 mg/dL (ref 6–20)
CO2: 23 mmol/L (ref 22–32)
Calcium: 8.5 mg/dL — ABNORMAL LOW (ref 8.9–10.3)
Chloride: 108 mmol/L (ref 101–111)
Creatinine, Ser: 0.87 mg/dL (ref 0.44–1.00)
Glucose, Bld: 90 mg/dL (ref 65–99)
POTASSIUM: 3.9 mmol/L (ref 3.5–5.1)
Sodium: 135 mmol/L (ref 135–145)
TOTAL PROTEIN: 7 g/dL (ref 6.5–8.1)

## 2015-10-03 LAB — URINALYSIS COMPLETE WITH MICROSCOPIC (ARMC ONLY)
Bilirubin Urine: NEGATIVE
DYSMORPHIC RBC: 0
Glucose, UA: NEGATIVE mg/dL
HGB URINE DIPSTICK: NEGATIVE
Ketones, ur: NEGATIVE mg/dL
LEUKOCYTES UA: NEGATIVE
Nitrite: NEGATIVE
PROTEIN: NEGATIVE mg/dL
RBC / HPF: 1 RBC/hpf (ref 0–5)
SPECIFIC GRAVITY, URINE: 1.009 (ref 1.005–1.030)
pH: 6 (ref 5.0–8.0)

## 2015-10-03 LAB — TYPE AND SCREEN
ABO/RH(D): O POS
ANTIBODY SCREEN: NEGATIVE

## 2015-10-03 LAB — POCT PREGNANCY, URINE: PREG TEST UR: POSITIVE — AB

## 2015-10-03 LAB — HCG, QUANTITATIVE, PREGNANCY: hCG, Beta Chain, Quant, S: 8108 m[IU]/mL — ABNORMAL HIGH (ref <5)

## 2015-10-03 SURGERY — SALPINGO-OOPHORECTOMY, LAPAROSCOPIC
Anesthesia: General | Laterality: Right | Wound class: Clean Contaminated

## 2015-10-03 MED ORDER — ONDANSETRON HCL 4 MG PO TABS: 4.0000 mg | ORAL_TABLET | Freq: Four times a day (QID) | ORAL | Status: DC | PRN

## 2015-10-03 MED ORDER — ONDANSETRON HCL 4 MG/2ML IJ SOLN
INTRAMUSCULAR | Status: DC | PRN
  Administered 2015-10-03: 4 mg via INTRAVENOUS

## 2015-10-03 MED ORDER — BUPIVACAINE HCL 0.5 % IJ SOLN
INTRAMUSCULAR | Status: DC | PRN
  Administered 2015-10-03: 5 mL

## 2015-10-03 MED ORDER — PROPOFOL 10 MG/ML IV BOLUS
INTRAVENOUS | Status: DC | PRN
  Administered 2015-10-03: 200 mg via INTRAVENOUS

## 2015-10-03 MED ORDER — PRENATAL MULTIVITAMIN CH: 1.0000 | ORAL_TABLET | Freq: Every day | ORAL | Status: DC

## 2015-10-03 MED ORDER — SILVER NITRATE-POT NITRATE 75-25 % EX MISC
CUTANEOUS | Status: AC
  Filled 2015-10-03: qty 5

## 2015-10-03 MED ORDER — KETOROLAC TROMETHAMINE 30 MG/ML IJ SOLN
INTRAMUSCULAR | Status: DC | PRN
  Administered 2015-10-03: 30 mg via INTRAVENOUS

## 2015-10-03 MED ORDER — ONDANSETRON HCL 4 MG/2ML IJ SOLN: 4.0000 mg | Freq: Once | INTRAMUSCULAR | Status: DC | PRN

## 2015-10-03 MED ORDER — IBUPROFEN 600 MG PO TABS
600.0000 mg | ORAL_TABLET | Freq: Four times a day (QID) | ORAL | Status: DC | PRN
  Administered 2015-10-04: 600 mg via ORAL
  Filled 2015-10-03: qty 1

## 2015-10-03 MED ORDER — IPRATROPIUM-ALBUTEROL 0.5-2.5 (3) MG/3ML IN SOLN
RESPIRATORY_TRACT | Status: AC
  Administered 2015-10-03: 3 mL via RESPIRATORY_TRACT
  Filled 2015-10-03: qty 3

## 2015-10-03 MED ORDER — FENTANYL CITRATE (PF) 100 MCG/2ML IJ SOLN
25.0000 ug | INTRAMUSCULAR | Status: DC | PRN
  Administered 2015-10-03 (×3): 25 ug via INTRAVENOUS

## 2015-10-03 MED ORDER — ONDANSETRON HCL 4 MG/2ML IJ SOLN: 4.0000 mg | Freq: Four times a day (QID) | INTRAMUSCULAR | Status: DC | PRN

## 2015-10-03 MED ORDER — ROCURONIUM BROMIDE 100 MG/10ML IV SOLN
INTRAVENOUS | Status: DC | PRN
  Administered 2015-10-03: 40 mg via INTRAVENOUS

## 2015-10-03 MED ORDER — HYDROCODONE-ACETAMINOPHEN 5-325 MG PO TABS
1.0000 | ORAL_TABLET | ORAL | Status: DC | PRN
  Administered 2015-10-04 (×2): 2 via ORAL
  Filled 2015-10-03 (×2): qty 2

## 2015-10-03 MED ORDER — DEXAMETHASONE SODIUM PHOSPHATE 10 MG/ML IJ SOLN
INTRAMUSCULAR | Status: DC | PRN
  Administered 2015-10-03: 5 mg via INTRAVENOUS

## 2015-10-03 MED ORDER — GLYCOPYRROLATE 0.2 MG/ML IJ SOLN
INTRAMUSCULAR | Status: DC | PRN
  Administered 2015-10-03: 0.6 mg via INTRAVENOUS
  Administered 2015-10-03: .4 mg via INTRAVENOUS

## 2015-10-03 MED ORDER — MORPHINE SULFATE (PF) 2 MG/ML IV SOLN
1.0000 mg | INTRAVENOUS | Status: DC | PRN
  Administered 2015-10-03 – 2015-10-04 (×3): 2 mg via INTRAVENOUS
  Filled 2015-10-03 (×2): qty 1

## 2015-10-03 MED ORDER — FENTANYL CITRATE (PF) 100 MCG/2ML IJ SOLN
INTRAMUSCULAR | Status: AC
  Filled 2015-10-03: qty 2

## 2015-10-03 MED ORDER — FENTANYL CITRATE (PF) 100 MCG/2ML IJ SOLN
INTRAMUSCULAR | Status: DC | PRN
  Administered 2015-10-03 (×2): 50 ug via INTRAVENOUS
  Administered 2015-10-03: 100 ug via INTRAVENOUS
  Administered 2015-10-03: 50 ug via INTRAVENOUS

## 2015-10-03 MED ORDER — BUPIVACAINE HCL (PF) 0.5 % IJ SOLN
INTRAMUSCULAR | Status: AC
  Filled 2015-10-03: qty 30

## 2015-10-03 MED ORDER — IPRATROPIUM-ALBUTEROL 0.5-2.5 (3) MG/3ML IN SOLN
3.0000 mL | Freq: Once | RESPIRATORY_TRACT | Status: AC
  Administered 2015-10-03: 3 mL via RESPIRATORY_TRACT

## 2015-10-03 MED ORDER — MORPHINE SULFATE (PF) 2 MG/ML IV SOLN
INTRAVENOUS | Status: AC
  Filled 2015-10-03: qty 1

## 2015-10-03 MED ORDER — NEOSTIGMINE METHYLSULFATE 10 MG/10ML IV SOLN
INTRAVENOUS | Status: DC | PRN
  Administered 2015-10-03: 4 mg via INTRAVENOUS
  Administered 2015-10-03: 3 mg via INTRAVENOUS

## 2015-10-03 MED ORDER — LIDOCAINE HCL (CARDIAC) 20 MG/ML IV SOLN
INTRAVENOUS | Status: DC | PRN
  Administered 2015-10-03: 30 mg via INTRAVENOUS

## 2015-10-03 MED ORDER — MIDAZOLAM HCL 2 MG/2ML IJ SOLN
INTRAMUSCULAR | Status: DC | PRN
  Administered 2015-10-03: 2 mg via INTRAVENOUS

## 2015-10-03 MED ORDER — LACTATED RINGERS IV SOLN
INTRAVENOUS | Status: DC
  Administered 2015-10-03 – 2015-10-04 (×4): via INTRAVENOUS

## 2015-10-03 SURGICAL SUPPLY — 40 items
BAG URO DRAIN 2000ML W/SPOUT (MISCELLANEOUS) IMPLANT
BLADE SURG SZ11 CARB STEEL (BLADE) ×3 IMPLANT
CANISTER SUCT 1200ML W/VALVE (MISCELLANEOUS) ×3 IMPLANT
CATH ROBINSON RED A/P 16FR (CATHETERS) ×3 IMPLANT
CHLORAPREP W/TINT 26ML (MISCELLANEOUS) ×3 IMPLANT
CLOSURE WOUND 1/4X4 (GAUZE/BANDAGES/DRESSINGS)
DRESSING TELFA 4X3 1S ST N-ADH (GAUZE/BANDAGES/DRESSINGS) ×6 IMPLANT
DRSG TEGADERM 2-3/8X2-3/4 SM (GAUZE/BANDAGES/DRESSINGS) ×9 IMPLANT
ENDOPOUCH RETRIEVER 10 (MISCELLANEOUS) ×3 IMPLANT
GAUZE SPONGE NON-WVN 2X2 STRL (MISCELLANEOUS) ×2 IMPLANT
GLOVE BIO SURGEON STRL SZ8 (GLOVE) ×12 IMPLANT
GOWN STRL REUS W/ TWL LRG LVL3 (GOWN DISPOSABLE) ×2 IMPLANT
GOWN STRL REUS W/ TWL XL LVL3 (GOWN DISPOSABLE) ×1 IMPLANT
GOWN STRL REUS W/TWL LRG LVL3 (GOWN DISPOSABLE) ×4
GOWN STRL REUS W/TWL XL LVL3 (GOWN DISPOSABLE) ×2
IRRIGATION STRYKERFLOW (MISCELLANEOUS) ×1 IMPLANT
IRRIGATOR STRYKERFLOW (MISCELLANEOUS) ×3
IV NS 1000ML (IV SOLUTION) ×2
IV NS 1000ML BAXH (IV SOLUTION) ×1 IMPLANT
KIT RM TURNOVER CYSTO AR (KITS) ×3 IMPLANT
LABEL OR SOLS (LABEL) ×3 IMPLANT
NS IRRIG 500ML POUR BTL (IV SOLUTION) ×3 IMPLANT
PACK GYN LAPAROSCOPIC (MISCELLANEOUS) ×3 IMPLANT
PAD OB MATERNITY 4.3X12.25 (PERSONAL CARE ITEMS) IMPLANT
PAD PREP 24X41 OB/GYN DISP (PERSONAL CARE ITEMS) ×3 IMPLANT
SET BERKELEY SUCTION TUBING (SUCTIONS) ×3 IMPLANT
SHEARS HARMONIC ACE PLUS 36CM (ENDOMECHANICALS) ×3 IMPLANT
SPONGE VERSALON 2X2 STRL (MISCELLANEOUS) ×4
STRIP CLOSURE SKIN 1/4X4 (GAUZE/BANDAGES/DRESSINGS) IMPLANT
SUT VIC AB 2-0 UR6 27 (SUTURE) ×3 IMPLANT
SUT VIC AB 4-0 SH 27 (SUTURE) ×2
SUT VIC AB 4-0 SH 27XANBCTRL (SUTURE) ×1 IMPLANT
SWABSTK COMLB BENZOIN TINCTURE (MISCELLANEOUS) IMPLANT
TRAP TISSUE FILTER (MISCELLANEOUS) ×3 IMPLANT
TROCAR ENDO BLADELESS 11MM (ENDOMECHANICALS) ×3 IMPLANT
TROCAR XCEL NON-BLD 5MMX100MML (ENDOMECHANICALS) ×3 IMPLANT
TROCAR XCEL UNIV SLVE 11M 100M (ENDOMECHANICALS) ×3 IMPLANT
TUBING INSUFFLATOR HI FLOW (MISCELLANEOUS) ×3 IMPLANT
VACURETTE 7MM F TIP (CANNULA) ×2
VACURETTE 7MM F TIP STRL (CANNULA) ×1 IMPLANT

## 2015-10-03 NOTE — Anesthesia Preprocedure Evaluation (Signed)
Anesthesia Evaluation  Patient identified by MRN, date of birth, ID band Patient awake    Reviewed: Allergy & Precautions, NPO status , Patient's Chart, lab work & pertinent test results  History of Anesthesia Complications Negative for: history of anesthetic complications  Airway Mallampati: II       Dental   Pulmonary Current Smoker,           Cardiovascular negative cardio ROS       Neuro/Psych negative neurological ROS  negative psych ROS   GI/Hepatic negative GI ROS, Neg liver ROS,   Endo/Other  negative endocrine ROS  Renal/GU negative Renal ROS     Musculoskeletal   Abdominal   Peds  Hematology negative hematology ROS (+)   Anesthesia Other Findings   Reproductive/Obstetrics (+) Pregnancy                             Anesthesia Physical Anesthesia Plan  ASA: II and emergent  Anesthesia Plan: General   Post-op Pain Management:    Induction: Intravenous  Airway Management Planned: Oral ETT  Additional Equipment:   Intra-op Plan:   Post-operative Plan:   Informed Consent: I have reviewed the patients History and Physical, chart, labs and discussed the procedure including the risks, benefits and alternatives for the proposed anesthesia with the patient or authorized representative who has indicated his/her understanding and acceptance.     Plan Discussed with:   Anesthesia Plan Comments:         Anesthesia Quick Evaluation

## 2015-10-03 NOTE — ED Notes (Signed)
Denies nausea or vomiting. abd non tender c/o of cramping pain that started today denies vaginal bleeding.

## 2015-10-03 NOTE — Anesthesia Postprocedure Evaluation (Signed)
Anesthesia Post Note  Patient: Nancy Watson  Procedure(s) Performed: Procedure(s) (LRB):  D&C,right ovarian cystectomy laparoscopic  (Right)  Patient location during evaluation: PACU Anesthesia Type: General Level of consciousness: awake and alert Pain management: pain level controlled Vital Signs Assessment: post-procedure vital signs reviewed and stable Respiratory status: spontaneous breathing and respiratory function stable Cardiovascular status: stable Anesthetic complications: no    Last Vitals:  Filed Vitals:   10/03/15 1400 10/03/15 1530  BP: 111/75 111/67  Pulse: 84 92  Temp:    Resp:      Last Pain:  Filed Vitals:   10/03/15 1756  PainSc: 0-No pain                 Reyan Helle K

## 2015-10-03 NOTE — Anesthesia Procedure Notes (Signed)
Procedure Name: Intubation Performed by: Caresse Sedivy Pre-anesthesia Checklist: Patient identified, Patient being monitored, Timeout performed, Emergency Drugs available and Suction available Patient Re-evaluated:Patient Re-evaluated prior to inductionOxygen Delivery Method: Circle system utilized Preoxygenation: Pre-oxygenation with 100% oxygen Intubation Type: IV induction Ventilation: Mask ventilation without difficulty Laryngoscope Size: Mac and 3 Grade View: Grade I Tube type: Oral Tube size: 7.0 mm Number of attempts: 1 Airway Equipment and Method: Stylet Placement Confirmation: ETT inserted through vocal cords under direct vision,  positive ETCO2 and breath sounds checked- equal and bilateral Secured at: 21 cm Tube secured with: Tape Dental Injury: Teeth and Oropharynx as per pre-operative assessment        

## 2015-10-03 NOTE — ED Notes (Signed)
Pt states was seen here 2-3 weeks ago and had an US done, states that at the time they could not rule out ectopic pregnancy. C/O pain to her lower abdomen 7/10, pulling sensation in nature. Pt's daughter at bedside at this time. Pt repositioned in bed and BP cuff/pulse oximeter applied to patient's finger. Pt given pillow at this time. Denies further needs at this time. Will continue to monitor for further patient needs.

## 2015-10-03 NOTE — ED Notes (Signed)
Pt requesting something to drink. Explained to patient waiting for results of US prior to being able to give her anything to drink. Denies other needs at this time, daughter at bedside at this time. Will continue to monitor for further needs.

## 2015-10-03 NOTE — ED Notes (Signed)
Pt taken to US at this time

## 2015-10-03 NOTE — OR Nursing (Signed)
crna Luella CookMark Nooe at bedside patient breathing is less labored breathing treatment given

## 2015-10-03 NOTE — OR Nursing (Signed)
Patient teking ice chips tolerating this well

## 2015-10-03 NOTE — Transfer of Care (Signed)
Immediate Anesthesia Transfer of Care Note  Patient: Nancy Watson  Procedure(s) Performed: Procedure(s):  D&C,right ovarian cystectomy laparoscopic  (Right)  Patient Location: PACU  Anesthesia Type:General  Level of Consciousness: awake, alert  and oriented  Airway & Oxygen Therapy: Patient Spontanous Breathing and Patient connected to face mask oxygen  Post-op Assessment: Report given to RN and Post -op Vital signs reviewed and stable  Post vital signs: Reviewed and stable  Last Vitals:  Filed Vitals:   10/03/15 1400 10/03/15 1530  BP: 111/75 111/67  Pulse: 84 92  Temp:    Resp:      Last Pain:  Filed Vitals:   10/03/15 1756  PainSc: 0-No pain         Complications: No apparent anesthesia complications

## 2015-10-03 NOTE — ED Notes (Signed)
This RN gave pt's daughter apple juice.

## 2015-10-03 NOTE — ED Notes (Signed)
This RN called to bedside, Pt again asks if she can go outside to smoke this RN informed patient that she could not go outside to smoke due to patient safety. Pt states that her IV came out, pt noted to not have an IV at this time.

## 2015-10-03 NOTE — ED Notes (Signed)
Pt returned from US at this time.

## 2015-10-03 NOTE — ED Notes (Signed)
Pt noted to be tearful at this time with her daughter at bedside. This RN gave patient a box of tissues at this time. Pt denies needs. Will continue to monitor for further needs at this time.

## 2015-10-03 NOTE — ED Provider Notes (Signed)
[facility] Emergency Department Provider Note   ____________________________________________   Time seen: I have reviewed the triage vital signs and the triage nursing note.  HISTORY  Chief Complaint Abdominal Pain   Historian Patient  HPI Nancy Watson is a 35 y.o. female (Gravida Para Term Preterm AB TAB SAB Ectopic Multiple Living  6 3 3  0 1 1 3  per recent Dr. Dalbert GarnetBeasley OB appointment), recent positive pregnancy test 2-3 weeks ago in ER, [facility].  She had repeat beta but not repeat us yet.  She had been having cramping without vaginal bleeding or discharge which had eased off some and then over the last day or so she's had worsening bilateral lower abdominal cramping. Denies dysuria. No fevers. No vomiting. Again no vaginal discharge or vaginal bleeding.     Past Medical History  Diagnosis Date  . Migraines   . Syncope     There are no active problems to display for this patient.   History reviewed. No pertinent past surgical history.  Current Outpatient Rx  Name  Route  Sig  Dispense  Refill  . cyclobenzaprine (FLEXERIL) 5 MG tablet   Oral   Take 1 tablet (5 mg total) by mouth 3 (three) times daily as needed for muscle spasms.   30 tablet   0   . DENTAGEL 1.1 % GEL dental gel   Mouth/Throat   Use as directed 1 application in the mouth or throat at bedtime. Use a pea-sized amount           Dispense as written.   . dicyclomine (BENTYL) 20 MG tablet   Oral   Take 1 tablet (20 mg total) by mouth 3 (three) times daily as needed for spasms.   30 tablet   0   . famotidine (PEPCID) 20 MG tablet   Oral   Take 1 tablet (20 mg total) by mouth 2 (two) times daily.   60 tablet   1   . ibuprofen (ADVIL,MOTRIN) 800 MG tablet   Oral   Take 1 tablet (800 mg total) by mouth every 8 (eight) hours as needed for moderate pain. Patient not taking: Reported on 02/11/2015   15 tablet   0   . loperamide  (IMODIUM A-D) 2 MG tablet   Oral   Take 1 tablet (2 mg total) by mouth 4 (four) times daily as needed for diarrhea or loose stools.   12 tablet   0   . meloxicam (MOBIC) 15 MG tablet   Oral   Take 1 tablet (15 mg total) by mouth daily. Patient not taking: Reported on 02/11/2015   30 tablet   0   . naproxen (NAPROSYN) 500 MG tablet   Oral   Take 1 tablet (500 mg total) by mouth 2 (two) times daily with a meal.   30 tablet   0   . ondansetron (ZOFRAN) 4 MG tablet   Oral   Take 1 tablet (4 mg total) by mouth every 8 (eight) hours as needed for nausea or vomiting.   12 tablet   0   . SUMAtriptan (IMITREX) 25 MG tablet   Oral   Take 25 mg by mouth every 2 (two) hours as needed for migraine.          . traMADol (ULTRAM) 50 MG tablet   Oral   Take 1 tablet (50 mg total) by mouth every 6 (six) hours as needed.  20 tablet   0     Allergies Review of patient's allergies indicates no known allergies.  No family history on file.  Social History Social History  Substance Use Topics  . Smoking status: Current Every Day Smoker    Types: Cigarettes  . Smokeless tobacco: None  . Alcohol Use: No    Review of Systems  Constitutional: Negative for fever. Eyes: Negative for visual changes. ENT: Negative for sore throat. Cardiovascular: Negative for chest pain. Respiratory: Negative for shortness of breath. Gastrointestinal: Negative for vomiting and diarrhea. Genitourinary: Negative for dysuria. Musculoskeletal: Negative for back pain. Skin: Negative for rash. Neurological: Negative for headache. 10 point Review of Systems otherwise negative ____________________________________________   PHYSICAL EXAM:  VITAL SIGNS: ED Triage Vitals  Enc Vitals Group     BP 10/03/15 0946 109/61 mmHg     Pulse Rate 10/03/15 0946 101     Resp 10/03/15 0946 16     Temp 10/03/15 0946 97.7 F (36.5 C)     Temp Source 10/03/15 0946 Oral     SpO2 10/03/15 0946 99 %     Weight  10/03/15 0946 144 lb (65.318 kg)     Height 10/03/15 0946  (1.676 m)     Head Cir --      Peak Flow --      Pain Score 10/03/15 0947 10     Pain Loc --      Pain Edu? --      Excl. in GC? --      Constitutional: Alert and oriented. Well appearing and in no distress. HEENT   Head: Normocephalic and atraumatic.      Eyes: Conjunctivae are normal. PERRL. Normal extraocular movements.      Ears:         Nose: No congestion/rhinnorhea.   Mouth/Throat: Mucous membranes are moist.   Neck: No stridor. Cardiovascular/Chest: Normal rate, regular rhythm.  No murmurs, rubs, or gallops. Respiratory: Normal respiratory effort without tachypnea nor retractions. Breath sounds are clear and equal bilaterally. No wheezes/rales/rhonchi. Gastrointestinal: Soft. No distention, no guarding, no rebound. Mild lower abdominal tenderness without focal McBurney's point tenderness.  Genitourinary/rectal:Deferred Musculoskeletal: Nontender with normal range of motion in all extremities. No joint effusions.  No lower extremity tenderness.  No edema. Neurologic:  Normal speech and language. No gross or focal neurologic deficits are appreciated. Skin:  Skin is warm, dry and intact. No rash noted. Psychiatric: Mood and affect are normal. Speech and behavior are normal. Patient exhibits appropriate insight and judgment.  ____________________________________________   EKG I, Governor Rooks, MD, the attending physician have personally viewed and interpreted all ECGs.  none ____________________________________________  LABS (pertinent positives/negatives)  Labs Reviewed  URINALYSIS COMPLETEWITH MICROSCOPIC ([facility] ONLY) - Abnormal; Notable for the following:    Color, Urine YELLOW (*)    APPearance CLEAR (*)    Bacteria, UA <1 (*)    Squamous Epithelial / LPF 0-5 (*)    All other components within normal limits  HCG, QUANTITATIVE, PREGNANCY - Abnormal; Notable for the following:    hCG, Beta  Chain, Quant, S 8108 (*)    All other components within normal limits  COMPREHENSIVE METABOLIC PANEL - Abnormal; Notable for the following:    Calcium 8.5 (*)    AST 14 (*)    ALT 8 (*)    Anion gap 4 (*)    All other components within normal limits  CBC WITH DIFFERENTIAL/PLATELET - Abnormal; Notable for the following:  Hemoglobin 11.4 (*)    HCT 33.1 (*)    All other components within normal limits  POCT PREGNANCY, URINE - Abnormal; Notable for the following:    Preg Test, Ur POSITIVE (*)    All other components within normal limits  POC URINE PREG, ED    ____________________________________________  RADIOLOGY All Xrays were viewed by me. Imaging interpreted by Radiologist.  Transvaginal ultrasound:IMPRESSION: 1. No intrauterine gestation identified. Given the history of a positive pregnancy test, differential considerations include intrauterine gestation too early to visualize, completed abortion, or nonvisualized ectopic pregnancy. Close clinical correlation is recommended with serial beta-hCG and followup ultrasound as warranted. 2. Interval increase in the large cystic lesion in the right adnexal space without overtly concerning characteristics on today's study. Continued close attention recommended. 3. No appreciable intraperitoneal free fluid. __________________________________________  PROCEDURES  Procedure(s) performed: None  Critical Care performed: None  ____________________________________________   ED COURSE / ASSESSMENT AND PLAN  Pertinent labs & imaging results that were available during my care of the patient were reviewed by me and considered in my medical decision making (see chart for details).   This patient is here with a positive pregnancy test being followed for rule out ectopic pregnancy by Dr. Dalbert GarnetBeasley, OB/GYN, and reports no vaginal bleeding, but worsening, lower abdominal cramping pain.  She is stable on arrival with vital signs and  abdominal exam with mild to moderate lower abdominal tenderness.  I reviewed her laboratory studies including blood type O positive.  Her beta hCG was 2000 recently and is today 8000.  Her ultrasound shows no visible IUP, continued visualize cystic lesion in the right adnexa without definite signs of acute trauma pregnancy.  I spoke with Dr. Feliberto GottronSchermerhorn, who spoke with this patient and evaluated her and he [facility] scope for evaluation of the adnexa given concern for possible ectopic.    CONSULTATIONS:   OB/GYN, Dr. Feliberto GottronSchermerhorn.   Patient / Family / Caregiver informed of clinical course, medical decision-making process, and agree with plan.   ___________________________________________   FINAL CLINICAL IMPRESSION(S) / ED DIAGNOSES   Final diagnoses:  Ectopic pregnancy              Note: This dictation was prepared with Dragon dictation. Any transcriptional errors that result from this process are unintentional   Governor Rooksebecca Braidyn Peace, MD 10/03/15 1440

## 2015-10-03 NOTE — ED Notes (Signed)
C/O stomach pain.  C/P suprapubic pain.  Onset of symptoms this morning.  Denies dysuria or vaginal discharge.

## 2015-10-03 NOTE — Brief Op Note (Signed)
10/03/2015  5:35 PM  PATIENT:  Nancy Watson  35 y.o. female  PRE-OPERATIVE DIAGNOSIS:  ectopic pregnancy, right ovarian cyst  POST-OPERATIVE DIAGNOSIS: Right ovarian cyst, right ovarian nodule probable cl cyst ,/ luteoma No evidence of ectopic pregnancy  PROCEDURE:  Suction D+C , laparoscopic right ovarian cystectomy , excision of right ovarian nodule  SURGEON:  Surgeon(s) and Role:    Suzy Bouchard* Samie Barclift J Houston Zapien, MD - Primary  PHYSICIAN ASSISTANT: scrub tech   ASSISTANTS: none   ANESTHESIA:   general  EBL:  Total I/O In: 1000 [I.V.:1000] Out: 110 [Urine:100; Blood:10]  BLOOD ADMINISTERED:none  DRAINS: none   LOCAL MEDICATIONS USED:  MARCAINE     SPECIMEN:  Source of Specimen:  products of conception from endometrium , rportion of right ovarian cyst wall , right ovarian nodule   DISPOSITION OF SPECIMEN:  PATHOLOGY  COUNTS:  YES  TOURNIQUET:  * No tourniquets in log *  DICTATION: .Other Dictation: Dictation Number verbal  PLAN OF CARE: Admit for overnight observation  PATIENT DISPOSITION:  PACU - hemodynamically stable.   Delay start of Pharmacological VTE agent (>24hrs) due to surgical blood loss or risk of bleeding: not applicable

## 2015-10-03 NOTE — Op Note (Signed)
[name]:  Nancy Watson, Nancy Watson              ACCOUNT NO.:  [id number]  MEDICAL RECORD NO.:  [id number]  LOCATION:  [facility]                         FACILITY:  [facility]  PHYSICIAN:  Jennell Cornerhomas Hiroshi Krummel, MDDATE OF BIRTH:  12-25-1980  DATE OF PROCEDURE:  10/03/2015 DATE OF DISCHARGE:                              OPERATIVE REPORT   PREOPERATIVE DIAGNOSIS: 1. Right ovarian cyst. 2. Abnormally rising quantitative beta hCG with possible right ectopic     pregnancy.  POSTOPERATIVE DIAGNOSIS: 1. Abnormally rising beta hCG with no evidence of ectopic pregnancy. 2. A 7 cm right ovarian cyst. 3. Right ovarian nodule.  PROCEDURE PERFORMED: 1. Suction dilation curettage. 2. Laparoscopic right ovarian cystectomy. 3. Laparoscopic right ovarian nodule excision.  SURGEON:  Jennell Cornerhomas Genene Kilman, MD  ANESTHESIA:  General endotracheal anesthesia.  SURGEON:  Jennell Cornerhomas Karsten Vaughn, MD  FIRST ASSISTANT:  Scrub tech.  INDICATION:  A 35 year old, gravida 6, para 3 patient with abnormally rising quantitative beta hCG with emergency room visit, the day of the procedure showing empty uterus and patient with lower abdominal pain, right greater than left with a 7 cm right ovarian cyst.  DESCRIPTION OF PROCEDURE:  After adequate general endotracheal anesthesia, the patient was placed in dorsal supine position.  Time-out was performed.  The patient's bladder was catheterized with a red Robinson catheter yielding 100 mL clear urine.  A weighted speculum was placed in the vagina and the anterior cervix was grasped with a single- tooth tenaculum and the KAHN cannula was placed in the endocervical canal to be used for uterine manipulation during the procedure.  Gloves were changed.  Attention directed to the patient's abdomen.  A 12 mm infraumbilical incision was made after injecting with 0.5% Marcaine. Laparoscope was advanced into the abdominal cavity under direct visualization utilizing the Optiview cannula.  The  patient's abdomen was insufflated with carbon dioxide.  A 2nd port site, a 5 mm port was advanced in the left lower quadrant, 3 cm medial to the left anterior iliac spine under direct visualization and after injecting with Marcaine, a 5 mm trocar was advanced under direct visualization. Initial evaluation of the pelvis revealed a large right ovarian cyst. There was no blood in the pelvis.  Both fallopian tubes appeared normal. Upper abdomen appeared normal.  Posterior cul-de-sac and anterior cul-de- sac were normal.  Harmonic Scalpel was brought up to the operative field and the ovarian cyst was opened.  The straw colored fluid was removed, approximately 20 mL.  Third port site was advanced into the right lower quadrant.  Abdomen under direct visualization.  Right ovarian cyst was grasped and a portion of the right ovarian cyst wall was removed with the graspers and this was sent to Pathology for identification.  The ovary was flipped over and on the proximal portion, at the base of the ovary, there was a 2 x 1 cm tan-colored nodule.  Given the appearance of this which did look to be either a corpus luteal cyst or a luteoma, was removed given the patient's context of a possible ectopic pregnancy. The rest of the ovary was left in situ and good hemostasis was noted after the excision of the nodule which also will be sent to Pathology  for identification.  The patient's abdomen was copiously irrigated and suctioned and the cannula were removed as well.  The infraumbilical incision was closed with a fascial layer of 2-0 Vicryl suture and all skin incisions were closed with interrupted 4-0 Vicryl suture.  Tegaderm dressings were applied.  The patient's legs were then elevated and weighted speculum was placed back into the vaginal vault and the KAHN cannula was removed and the cervix was dilated to a #20 Hanks dilator and a #7 flexible suction curette was advanced into the endometrial cavity.   Suction occurred with no identifiable tissue consistent with products of conception, only some bloody discharge.  The specimen will be sent to Pathology as well.  There were no complications.  ESTIMATED BLOOD LOSS:  Minimal.  INTRAOPERATIVE FLUIDS:  1000 mL.  URINE OUTPUT:  100 mL.  Patient tolerated the procedure well and was taken to recovery in good condition.          ______________________________ Jennell Cornerhomas Anasophia Pecor, MD     TS/MEDQ  D:  10/03/2015  T:  10/03/2015  Job:  [id number]

## 2015-10-03 NOTE — ED Notes (Signed)
Report called to Darl PikesSusan, RN. Pt taken to the OR, Darl PikesSusan aware of patient status.

## 2015-10-04 ENCOUNTER — Encounter: Payer: Self-pay | Admitting: Obstetrics and Gynecology

## 2015-10-04 LAB — CBC
HEMATOCRIT: 30.7 % — AB (ref 35.0–47.0)
Hemoglobin: 10.5 g/dL — ABNORMAL LOW (ref 12.0–16.0)
MCH: 28.5 pg (ref 26.0–34.0)
MCHC: 34.1 g/dL (ref 32.0–36.0)
MCV: 83.4 fL (ref 80.0–100.0)
Platelets: 254 10*3/uL (ref 150–440)
RBC: 3.68 MIL/uL — ABNORMAL LOW (ref 3.80–5.20)
RDW: 13.2 % (ref 11.5–14.5)
WBC: 6.2 10*3/uL (ref 3.6–11.0)

## 2015-10-04 LAB — HCG, QUANTITATIVE, PREGNANCY: hCG, Beta Chain, Quant, S: 7533 m[IU]/mL — ABNORMAL HIGH (ref <5)

## 2015-10-04 MED ORDER — ONDANSETRON HCL 4 MG PO TABS: 4.0000 mg | ORAL_TABLET | Freq: Four times a day (QID) | ORAL | Status: DC | PRN

## 2015-10-04 MED ORDER — IBUPROFEN 600 MG PO TABS: 600.0000 mg | ORAL_TABLET | Freq: Four times a day (QID) | ORAL | Status: DC | PRN

## 2015-10-04 MED ORDER — SIMETHICONE 80 MG PO CHEW: 80.0000 mg | CHEWABLE_TABLET | Freq: Four times a day (QID) | ORAL | Status: DC | PRN

## 2015-10-04 MED ORDER — DOCUSATE SODIUM 100 MG PO CAPS: 100.0000 mg | ORAL_CAPSULE | Freq: Two times a day (BID) | ORAL | Status: DC

## 2015-10-04 MED ORDER — HYDROCODONE-ACETAMINOPHEN 5-325 MG PO TABS: 1.0000 | ORAL_TABLET | ORAL | Status: DC | PRN

## 2015-10-04 MED ORDER — MENTHOL 3 MG MT LOZG
1.0000 | LOZENGE | OROMUCOSAL | Status: DC | PRN
  Filled 2015-10-04: qty 9

## 2015-10-04 NOTE — Discharge Summary (Signed)
Physician Discharge Summary  Patient ID: Nancy Watson MRN: [id number] DOB/AGE: 34/04/1980 34 y.o.  Admit date: 10/03/2015 Discharge date: 10/04/2015  Admission Diagnoses:abnormal quants , r/o ectopic; right ovarian cyst  Discharge Diagnoses: right ovarian cyst and nodule . No evidence of ectopic  Active Problems:   Right ovarian cyst   Post-operative state   Discharged Condition: good  Hospital Course: under went suction D+C and L/S right ovarian cystectomy and nodule excision   Consults: None  Significant Diagnostic Studies:  Quant 8100--> 7533. HCT 30.7 on d/c   Treatments: surgery: as  above  Discharge Exam: Blood pressure 93/51, pulse 67, temperature 98.8 F (37.1 C), temperature source Oral, resp. rate 20, height 5\' 6"  (1.676 m), weight 144 lb (65.318 kg), SpO2 99 %. General appearance: alert and cooperative GI: soft, non-tender; bowel sounds normal; no masses,  no organomegaly  Disposition: 01-Home or Self Care  Discharge Instructions    Call MD for:  difficulty breathing, headache or visual disturbances    Complete by:  As directed      Call MD for:  extreme fatigue    Complete by:  As directed      Call MD for:  hives    Complete by:  As directed      Call MD for:  persistant dizziness or light-headedness    Complete by:  As directed      Call MD for:  persistant nausea and vomiting    Complete by:  As directed      Call MD for:  redness, tenderness, or signs of infection (pain, swelling, redness, odor or green/yellow discharge around incision site)    Complete by:  As directed      Call MD for:  severe uncontrolled pain    Complete by:  As directed      Call MD for:  temperature >100.4    Complete by:  As directed      Call MD for:    Complete by:  As directed      Diet - low sodium heart healthy    Complete by:  As directed      Discharge instructions    Complete by:  As directed   To [facility] lab this Friday and Next Monday for repeat Lab work  [facility]     Increase activity slowly    Complete by:  As directed             Medication List    STOP taking these medications        cyclobenzaprine 5 MG tablet  Commonly known as:  FLEXERIL     DENTAGEL 1.1 % Gel dental gel  Generic drug:  sodium fluoride     dicyclomine 20 MG tablet  Commonly known as:  BENTYL     famotidine 20 MG tablet  Commonly known as:  PEPCID     loperamide 2 MG tablet  Commonly known as:  IMODIUM A-D     meloxicam 15 MG tablet  Commonly known as:  MOBIC     naproxen 500 MG tablet  Commonly known as:  NAPROSYN     SUMAtriptan 25 MG tablet  Commonly known as:  IMITREX     traMADol 50 MG tablet  Commonly known as:  ULTRAM      TAKE these medications        docusate sodium 100 MG capsule  Commonly known as:  COLACE  Take 1 capsule (100 mg  total) by mouth 2 (two) times daily.     HYDROcodone-acetaminophen 5-325 MG tablet  Commonly known as:  NORCO/VICODIN  Take 1-2 tablets by mouth every 4 (four) hours as needed for moderate pain.     ibuprofen 600 MG tablet  Commonly known as:  ADVIL,MOTRIN  Take 1 tablet (600 mg total) by mouth every 6 (six) hours as needed (mild pain).     ondansetron 4 MG tablet  Commonly known as:  ZOFRAN  Take 1 tablet (4 mg total) by mouth every 6 (six) hours as needed for nausea.     simethicone 80 MG chewable tablet  Commonly known as:  GAS-X  Chew 1 tablet (80 mg total) by mouth every 6 (six) hours as needed for flatulence.           Follow-up Information    Follow up with Jennell CornerSCHERMERHORN,Tahara Ruffini, MD.   Specialty:  Obstetrics and Gynecology   Why:  following quants and wound check    Contact information:   [address] [facility] [place] [address] [phone]       Signed: Jennell CornerSCHERMERHORN,Amyriah Buras 10/04/2015, 9:26 AM

## 2015-10-04 NOTE — Progress Notes (Signed)
Patient discharged home with family. Discharge instructions, prescriptions and follow up appointment given to and reviewed with patient and family. Patient verbalized understanding. Escorted out via wheelchair by axiliary. 

## 2015-10-06 LAB — SURGICAL PATHOLOGY

## 2015-10-06 NOTE — H&P (Signed)
[name]:  Nancy Watson, Nancy Watson              ACCOUNT NO.:  [id number]  MEDICAL RECORD NO.:  [id number]  LOCATION:                                 FACILITY:  PHYSICIAN:  Jennell Cornerhomas Schermerhorn, MDDATE OF BIRTH:  12-19-1980  DATE OF ADMISSION:  10/03/2015 DATE OF DISCHARGE:                            HISTORY AND PHYSICAL   HISTORY OF PRESENT ILLNESS:  A 35 year old, gravida 6, para 3-0-2-3 presented to the emergency department with lower abdominal cramping that awoke her from sleep.  The patient has been followed for similar symptoms for the past 10 days.  My partner, Dr. Christeen DouglasBethany Beasley has been involved in her care.  Serial quantitative hCGs have been rising, initially on September 23, 2015, level was 228; on September 29, 2015, level was 2106; and today's quantitative hCG of 8108.  The patient has had several ultrasounds.  [facility] a 0.6 cm gestational sac with a yolk sac.  No fetal pole identified.  Also demonstrated, 7 cm anechoic right ovarian cyst.  The patient had an ultrasound previous [facility] that showed an empty uterus, showed a 7 cm right ovarian cyst, and a 2.8 cm hyperechoic mass juxtaposed to the right ovary.  Vaginal ultrasound today shows no gestational sac.  No yolk sac.  No embryo.  The patient still has a 7.0 x 5.0 x 7.1 cm right adnexal mass and the previously noted 2.8 cm lesion juxtaposed to the ovary was not visualized today. The patient has had no bleeding.  PAST MEDICAL HISTORY:  Adjustment disorder, anxiety disorder, depression, cervical dysplasia, history of chlamydia, gastroesophageal reflux disease, genital herpes, history of migraines, history of syncope with negative workup, and thyromegaly.  PAST SURGICAL HISTORY:  Voluntary interruption of pregnancy and dilation and curettage for SAB.  FAMILY HISTORY:  No gynecologic cancer.  SOCIAL HISTORY:  The patient smokes, does not use illicit  drugs.  ALLERGIES:  NO KNOWN DRUG ALLERGIES.  CURRENT MEDICATIONS:  Triamcinolone 0.1% cream and Valtrex 500 mg with herpetic outbreaks.  REVIEW OF SYSTEMS:  Unremarkable other than genitourinary, history of chlamydia in the past 4 years.  PHYSICAL EXAMINATION:  GENERAL:  A well-developed, well-nourished black female, in no acute distress.  VITAL SIGNS:  Blood pressure 111/75, pulse 84, pulse ox 99.  LUNGS:  Clear to auscultation.  CARDIOVASCULAR:  Regular rate and rhythm.  ABDOMEN:  Soft.  Slight tenderness suprapubically.  No rebound tenderness.  PELVIC:  Deferred.  LABORATORY STUDIES:  Hematocrit 33.1.  Beta hCG of 8108.  ASSESSMENT:  Rising quantitative beta hCGs with no evidence of intrauterine pregnancy, although ultrasound 4 days ago showed a gestational sac, irregularly-shaped, and a small yolk sac, this would be consistent with possible ectopic pregnancy.  The patient is known to have a right ovarian cyst as well.  PLAN:  I have spoken to the patient regarding suction dilation curettage, diagnostic laparoscopy with right ovarian cystotomy, and if ectopic found, probable right salpingectomy.  The patient has been counseled regarding the risk of the procedure including organ injury, bowel, bladder, ureters, risk of infection, risk of bleeding to the extent of blood transfusion, and risk of infectious disease if  blood is given.  The patient understands these risks.  She has been n.p.o. since yesterday.    ______________________________ Jennell Cornerhomas Schermerhorn, MD   ______________________________ Jennell Cornerhomas Schermerhorn, MD    TS/MEDQ  D:  10/03/2015  T:  10/03/2015  Job:  [id number]

## 2015-10-07 ENCOUNTER — Emergency Department
Admission: EM | Admit: 2015-10-07 | Discharge: 2015-10-07 | Disposition: A | Discharge status: Home / Self Care | Payer: No Typology Code available for payment source

## 2015-10-07 ENCOUNTER — Ambulatory Visit
Admission: RE | Admit: 2015-10-07 | Discharge: 2015-10-07 | Disposition: A | Discharge status: Home / Self Care | Payer: Medicaid Other | Source: Ambulatory Visit | Attending: Obstetrics and Gynecology | Admitting: Obstetrics and Gynecology

## 2015-10-07 ENCOUNTER — Other Ambulatory Visit: Payer: Self-pay | Admitting: Obstetrics and Gynecology

## 2015-10-07 ENCOUNTER — Encounter: Payer: Self-pay | Admitting: Emergency Medicine

## 2015-10-07 DIAGNOSIS — O009 Unspecified ectopic pregnancy without intrauterine pregnancy: Secondary | ICD-10-CM | POA: Insufficient documentation

## 2015-10-07 DIAGNOSIS — Z3A00 Weeks of gestation of pregnancy not specified: Secondary | ICD-10-CM | POA: Diagnosis not present

## 2015-10-07 DIAGNOSIS — O3680X0 Pregnancy with inconclusive fetal viability, not applicable or unspecified: Secondary | ICD-10-CM | POA: Diagnosis not present

## 2015-10-07 DIAGNOSIS — R938 Abnormal findings on diagnostic imaging of other specified body structures: Secondary | ICD-10-CM | POA: Insufficient documentation

## 2015-10-07 NOTE — ED Notes (Signed)
Pt registered as an ED pt by mistake pt here for outpt CT scan only per Dr. Dalbert GarnetBeasley. Patient will be reregistered outpt.

## 2015-10-07 NOTE — ED Notes (Addendum)
Patient presents to ED with c/o lower abdominal pain for a month. Pt reports [facility]. Pt denies urinary symptoms, chest pain, or shortness of breath. Pt alert and oriented x 4, no increased work in breathing noted, skin warm and dry. Patient reports was seen here Sunday and was admitted due to possible ectopic pregnancy, states was informed by PCP today that HCG levels had tripled.  Pt states "they went in with cameras to see if they could see the baby but they told me they did not see it but said I was pregnant."

## 2015-10-08 ENCOUNTER — Ambulatory Visit
Admission: RE | Admit: 2015-10-08 | Discharge: 2015-10-08 | Disposition: A | Discharge status: Home / Self Care | Payer: Medicaid Other | Source: Ambulatory Visit | Attending: Obstetrics and Gynecology | Admitting: Obstetrics and Gynecology

## 2015-10-08 DIAGNOSIS — O009 Unspecified ectopic pregnancy without intrauterine pregnancy: Secondary | ICD-10-CM | POA: Insufficient documentation

## 2015-10-08 MED ORDER — IOPAMIDOL (ISOVUE-300) INJECTION 61%
100.0000 mL | Freq: Once | INTRAVENOUS | Status: AC | PRN
  Administered 2015-10-08: 100 mL via INTRAVENOUS

## 2015-10-21 ENCOUNTER — Ambulatory Visit
Admission: RE | Admit: 2015-10-21 | Discharge: 2015-10-21 | Disposition: A | Discharge status: Home / Self Care | Payer: Medicaid Other | Source: Ambulatory Visit | Attending: Maternal & Fetal Medicine | Admitting: Maternal & Fetal Medicine

## 2015-10-21 ENCOUNTER — Other Ambulatory Visit: Payer: Self-pay

## 2015-10-21 VITALS — BP 114/62 | HR 82 | Temp 98.7°F | Resp 18 | Wt 150.0 lb

## 2015-10-21 DIAGNOSIS — Z3482 Encounter for supervision of other normal pregnancy, second trimester: Secondary | ICD-10-CM

## 2015-10-21 DIAGNOSIS — Z3A08 8 weeks gestation of pregnancy: Secondary | ICD-10-CM | POA: Diagnosis not present

## 2015-10-21 DIAGNOSIS — Z36 Encounter for antenatal screening of mother: Secondary | ICD-10-CM | POA: Diagnosis not present

## 2015-10-21 DIAGNOSIS — O208 Other hemorrhage in early pregnancy: Secondary | ICD-10-CM | POA: Diagnosis not present

## 2015-10-21 DIAGNOSIS — Z3481 Encounter for supervision of other normal pregnancy, first trimester: Secondary | ICD-10-CM | POA: Diagnosis present

## 2015-10-21 DIAGNOSIS — O09891 Supervision of other high risk pregnancies, first trimester: Secondary | ICD-10-CM

## 2015-10-21 NOTE — Progress Notes (Signed)
Referring MD: Christeen DouglasBeasley, Bethany, MD 30 minute consultation  Ms. Nancy Watson was referred to [facility] in [place] to discuss her first trimester exposure to an abdominal CT and the potential risks to the developing fetus.  The following is a summary of our discussion.    To review and per Dr. Francena HanlyBeasley's note dictated on 10/08/15 and scanned in to Epic): 09/21/15 the patient presented to the ER with pelvic pain and a positive pregnancy test.  Beta quant below discriminatory zone, desired pregnancy, large 7x5 cm simple cyst on R ovary and Sosaia Pittinger 2cm complex cyst on same ovary.  No vaginal bleeding, labs normal, VSS ectopic precautions given.  Beta 97.  6/27: Beta 2106([facility])  6/28: Ultrasound with Briyana Badman gestational-appearing sac measuring 6.663mm (5.2 wks) and a yolk sac, no fetal pole ([facility]) Simple 6cm R ovarian cyst  7/2: Beta 8106.  Presented to ER with increasing pelvic pain.  Ultrasound wit no gestationla sac/no yolk sac, larger cyst on R ovary 7x7 cm, no other cyst seen, no free fluid in pelvis([facility]).  Patient taken to OR for suction D&C, dx lap and removeal of both ovarian cysts.  Pathology returned no POC in uterus, corpus luteum and follicular cyst on R ovary.  We are left with still dx of PUL.  7/3: Beta 7533 (postop day  #1)  7/6:  Beta 15,586 ([facility]).  Patient presents to office with increasing pelvic pain, no abdominal pain.  Pathology reviewed.  CT scan stat order placed after discussion with radiologist with concerns for possible abdominal ectopic, still PUL.  Motrin, tramadol, percocet given.  7/7: CT scan results negative for all abdominal and pelvic abnormalities.  No free fluid in pelvis.  1cm anechoic fluid in uterus.  A bedside ultrasound and beta were performed at [facility] on 10/13/15.  Beta 8000 ([facility]) and estimated gestational age of fetus is 6 weeks 6/7 days.  The patient has not had another ultrasound to document viability since 7/12/17and her beta [facility]) was reported at  8000.  We explained to the patient that the radiation risk to the fetus from a CT scan is not expected to increase the risk for major malformations and birth defects.  A limited ultrasound was performed today.  Fetal cardiac activity was noted; estimated gestational age is 8 weeks.  The risks, benefits and limitations of first trimester screening were discussed with the patient and Ms. Leyendecker expressed that she is interested in having this test performed.  An appointment was scheduled in four weeks.  At this time, the patient has declined carrier screening for CF and SMA.  The patient was encouraged to contact us with questions or concerns.  We may be reached at [phone].

## 2015-10-21 NOTE — Progress Notes (Signed)
Agree with assessment and plans as outlined [facility].  Recommend follow up viability scan.  Please schedule where convenient to the patient.

## 2015-10-28 ENCOUNTER — Emergency Department: Payer: Medicaid Other

## 2015-10-28 ENCOUNTER — Emergency Department
Admission: EM | Admit: 2015-10-28 | Discharge: 2015-10-28 | Disposition: A | Discharge status: Home / Self Care | Payer: Medicaid Other | Attending: Emergency Medicine | Admitting: Emergency Medicine

## 2015-10-28 DIAGNOSIS — Z3A09 9 weeks gestation of pregnancy: Secondary | ICD-10-CM | POA: Insufficient documentation

## 2015-10-28 DIAGNOSIS — O200 Threatened abortion: Secondary | ICD-10-CM | POA: Diagnosis not present

## 2015-10-28 DIAGNOSIS — F17210 Nicotine dependence, cigarettes, uncomplicated: Secondary | ICD-10-CM | POA: Insufficient documentation

## 2015-10-28 DIAGNOSIS — N939 Abnormal uterine and vaginal bleeding, unspecified: Secondary | ICD-10-CM | POA: Diagnosis present

## 2015-10-28 LAB — URINALYSIS COMPLETE WITH MICROSCOPIC (ARMC ONLY)
BACTERIA UA: NONE SEEN
Bilirubin Urine: NEGATIVE
Glucose, UA: NEGATIVE mg/dL
HGB URINE DIPSTICK: NEGATIVE
Ketones, ur: NEGATIVE mg/dL
LEUKOCYTES UA: NEGATIVE
NITRITE: NEGATIVE
PH: 8 (ref 5.0–8.0)
Protein, ur: NEGATIVE mg/dL
Specific Gravity, Urine: 1.005 (ref 1.005–1.030)

## 2015-10-28 LAB — CHLAMYDIA/NGC RT PCR (ARMC ONLY)
Chlamydia Tr: NOT DETECTED
N gonorrhoeae: NOT DETECTED

## 2015-10-28 LAB — WET PREP, GENITAL
Clue Cells Wet Prep HPF POC: NONE SEEN
Sperm: NONE SEEN
TRICH WET PREP: NONE SEEN
YEAST WET PREP: NONE SEEN

## 2015-10-28 LAB — HCG, QUANTITATIVE, PREGNANCY: hCG, Beta Chain, Quant, S: 82379 m[IU]/mL — ABNORMAL HIGH (ref <5)

## 2015-10-28 NOTE — ED Triage Notes (Signed)
Pt states she is approximately [redacted] weeks pregnant and woke up having vaginal bleeding. Denies any pain at present.Marland Kitchen

## 2015-10-28 NOTE — ED Provider Notes (Signed)
[facility] Emergency Department Provider Note   ____________________________________________   First MD Initiated Contact with Patient 10/28/15 [phone]     (approximate)  I have reviewed the triage vital signs and the nursing notes.   HISTORY  Chief Complaint Vaginal Bleeding   HPI Nancy Watson is a 35 y.o. female who is a G7 with one abortion as well as one miscarriage who is presenting to the emergency department today with vaginal bleeding. She says that when she woke up and went to the bathroom this morning she had bright red blood in the toilet. She denies any clots. She denies any pain. Says that she is about [redacted] weeks pregnant. Denies any nausea or vomiting. Is also taking Macrobid right now for a urinary tract infection. Denies any burning with urination.   Past Medical History:  Diagnosis Date  . Migraines   . Syncope     Patient Active Problem List   Diagnosis Date Noted  . Right ovarian cyst 10/03/2015  . Post-operative state 10/03/2015    Past Surgical History:  Procedure Laterality Date  . LAPAROSCOPIC SALPINGO OOPHERECTOMY Right 10/03/2015   Procedure:  D&C,right ovarian cystectomy laparoscopic ,laparoscopic excision right ovarian mass;  Surgeon: Suzy Bouchard, MD;  Location: [facility] ORS;  Service: Gynecology;  Laterality: Right;    Prior to Admission medications   Not on File    Allergies Review of patient's allergies indicates no known allergies.  No family history on file.  Social History Social History  Substance Use Topics  . Smoking status: Current Every Day Smoker    Packs/day: 0.50    Types: Cigarettes  . Smokeless tobacco: Never Used  . Alcohol use No    Review of Systems Constitutional: No fever/chills Eyes: No visual changes. ENT: No sore throat. Cardiovascular: Denies chest pain. Respiratory: Denies shortness of breath. Gastrointestinal: No abdominal pain.  No nausea, no vomiting.  No diarrhea.  No  constipation. Genitourinary: Negative for dysuria. Musculoskeletal: Negative for back pain. Skin: Negative for rash. Neurological: Negative for headaches, focal weakness or numbness.  10-point ROS otherwise negative.  ____________________________________________   PHYSICAL EXAM:  VITAL SIGNS: ED Triage Vitals  Enc Vitals Group     BP 10/28/15 0756 117/77     Pulse Rate 10/28/15 0756 81     Resp 10/28/15 0756 18     Temp 10/28/15 0756 98.6 F (37 C)     Temp Source 10/28/15 0756 Oral     SpO2 10/28/15 0756 100 %     Weight 10/28/15 0756 141 lb (64 kg)     Height 10/28/15 0756 5\' 6"  (1.676 m)     Head Circumference --      Peak Flow --      Pain Score 10/28/15 0749 0     Pain Loc --      Pain Edu? --      Excl. in GC? --     Constitutional: Alert and oriented. Well appearing and in no acute distress. Eyes: Conjunctivae are normal. PERRL. EOMI. Head: Atraumatic. Nose: No congestion/rhinnorhea. Mouth/Throat: Mucous membranes are moist.  Neck: No stridor.   Cardiovascular: Normal rate, regular rhythm. Grossly normal heart sounds.   Respiratory: Normal respiratory effort.  No retractions. Lungs CTAB. Gastrointestinal: Soft and nontender. No distention.  Genitourinary: Normal external appearance. Speculum exam without any bleeding or discharge. Bimanual exam without cervical motion tenderness. Cervical os is closed. No tenderness to the uterus or the bilateral adnexa. No masses. Musculoskeletal: No  lower extremity tenderness nor edema.  No joint effusions. Neurologic:  Normal speech and language. No gross focal neurologic deficits are appreciated.  Skin:  Skin is warm, dry and intact. No rash noted. Psychiatric: Mood and affect are normal. Speech and behavior are normal.  ____________________________________________   LABS (all labs ordered are listed, but only abnormal results are displayed)  Labs Reviewed  HCG, QUANTITATIVE, PREGNANCY  URINALYSIS COMPLETEWITH  MICROSCOPIC ([facility] ONLY)  POC URINE PREG, ED   ____________________________________________  EKG   ____________________________________________  RADIOLOGY  CLINICAL DATA:  Vaginal bleeding for 1 day EXAM: OBSTETRIC <14 WK [place] AND TRANSVAGINAL OB US TECHNIQUE: Both transabdominal and transvaginal ultrasound examinations were performed for complete evaluation of the gestation as well as the maternal uterus, adnexal regions, and pelvic cul-de-sac. Transvaginal technique was performed to assess early pregnancy. COMPARISON:  October 21, 2015 FINDINGS: Intrauterine gestational sac: Visualized Yolk sac:  Visualized Embryo:  Visualized Cardiac Activity: Visualized Heart Rate: 180  bpm CRL:  24  mm   9 w   1 d                  [place] EDC: May 31, 2008 Subchorionic hemorrhage: There is a 4 x 3 mm subchorionic hemorrhage. This small subchorionic hemorrhage is slightly less apparent compared to recent prior study. Maternal uterus/adnexae: Cervical os is closed. Maternal ovaries are normal in size and contour bilaterally. There is a probable hemorrhagic corpus luteum on the left measuring 1.1 x 1.1 cm. No appreciable free pelvic fluid. IMPRESSION: Single live intrauterine gestation with estimated gestational age of approximately 9 weeks. Small subchorionic hemorrhage. Hemorrhagic corpus luteum on the left. Electronically Signed   By: Bretta Bang III M.D.   On: 10/28/2015 10:15 ____________________________________________   PROCEDURES  Procedures  ____________________________________________   INITIAL IMPRESSION / ASSESSMENT AND PLAN / ED COURSE  Pertinent labs & imaging results that were available during my care of the patient were reviewed by me and considered in my medical decision making (see chart for details).  Patient also with a right sided recent cystectomy to the right ovary on July 2. She also had a radiation exposure which made her to be considered a high-risk  during this pregnancy and had an ultrasound on July 20. However, unfortunately cannot view the ultrasound report although I tried multiple places including care everywhere as well as our results section.  Clinical Course   ----------------------------------------- 12:49 PM on 10/28/2015 -----------------------------------------  Patient is resting comfortable at this time and has not had any further episodes of bleeding.  We reviewed her imaging as well as lab results. She'll continue to take her Macrobid until the course is completed. It appears that she has been identified to have a subchorionic hemorrhage. We discussed precautions such as pelvic rest including no sexual intercourse as well as no heavy lifting. She is understanding of these plans willing to comply. She'll be following up with her known OB/GYN. She will continue to take her progesterone as well as her prenatal vitamins. We also discussed that there are several possible outcomes including miscarriage as well as caring the baby to term. ____________________________________________   FINAL CLINICAL IMPRESSION(S) / ED DIAGNOSES  Threatened abortion.    NEW MEDICATIONS STARTED DURING THIS VISIT:  New Prescriptions   No medications on file     Note:  This document was prepared using Dragon voice recognition software and may include unintentional dictation errors.    Myrna Blazer, MD 10/28/15 [phone]

## 2015-10-28 NOTE — ED Notes (Signed)
She is currently in [place] 

## 2015-11-18 ENCOUNTER — Ambulatory Visit
Admission: RE | Admit: 2015-11-18 | Discharge: 2015-11-18 | Disposition: A | Discharge status: Home / Self Care | Payer: Medicaid Other | Source: Ambulatory Visit | Attending: Maternal & Fetal Medicine | Admitting: Maternal & Fetal Medicine

## 2015-11-18 ENCOUNTER — Other Ambulatory Visit: Payer: Self-pay | Admitting: Maternal & Fetal Medicine

## 2015-11-18 DIAGNOSIS — O09891 Supervision of other high risk pregnancies, first trimester: Secondary | ICD-10-CM

## 2015-11-18 DIAGNOSIS — Z369 Encounter for antenatal screening, unspecified: Secondary | ICD-10-CM

## 2015-11-18 DIAGNOSIS — O321XX0 Maternal care for breech presentation, not applicable or unspecified: Secondary | ICD-10-CM | POA: Insufficient documentation

## 2015-11-18 DIAGNOSIS — Z3A12 12 weeks gestation of pregnancy: Secondary | ICD-10-CM | POA: Insufficient documentation

## 2015-11-18 DIAGNOSIS — Z3481 Encounter for supervision of other normal pregnancy, first trimester: Secondary | ICD-10-CM | POA: Diagnosis present

## 2015-11-18 DIAGNOSIS — Z3482 Encounter for supervision of other normal pregnancy, second trimester: Secondary | ICD-10-CM

## 2015-11-18 NOTE — Progress Notes (Signed)
Referring physician:  [facility]  Nancy Watson was referred previously for genetic counseling due to first trimester CT exposure.  At that time, she was scheduled to return today for first trimester screening for aneuploidy.  We met briefly today and reviewing this testing option, which she elected to proceed with.  See prior genetic counseling note from 10/21/15.  Wilburt Finlay, MS, CGC

## 2015-11-20 ENCOUNTER — Emergency Department
Admission: EM | Admit: 2015-11-20 | Discharge: 2015-11-20 | Disposition: A | Discharge status: Home / Self Care | Payer: Medicaid Other | Attending: Emergency Medicine | Admitting: Emergency Medicine

## 2015-11-20 ENCOUNTER — Encounter: Payer: Self-pay | Admitting: Emergency Medicine

## 2015-11-20 DIAGNOSIS — N939 Abnormal uterine and vaginal bleeding, unspecified: Secondary | ICD-10-CM | POA: Diagnosis present

## 2015-11-20 DIAGNOSIS — F17210 Nicotine dependence, cigarettes, uncomplicated: Secondary | ICD-10-CM | POA: Insufficient documentation

## 2015-11-20 DIAGNOSIS — O200 Threatened abortion: Secondary | ICD-10-CM | POA: Diagnosis not present

## 2015-11-20 DIAGNOSIS — Z3A12 12 weeks gestation of pregnancy: Secondary | ICD-10-CM | POA: Diagnosis not present

## 2015-11-20 DIAGNOSIS — O99331 Smoking (tobacco) complicating pregnancy, first trimester: Secondary | ICD-10-CM | POA: Insufficient documentation

## 2015-11-20 LAB — CBC
HEMATOCRIT: 33.5 % — AB (ref 35.0–47.0)
HEMOGLOBIN: 11.7 g/dL — AB (ref 12.0–16.0)
MCH: 29.3 pg (ref 26.0–34.0)
MCHC: 35 g/dL (ref 32.0–36.0)
MCV: 83.8 fL (ref 80.0–100.0)
Platelets: 269 10*3/uL (ref 150–440)
RBC: 4 MIL/uL (ref 3.80–5.20)
RDW: 13.7 % (ref 11.5–14.5)
WBC: 4.6 10*3/uL (ref 3.6–11.0)

## 2015-11-20 LAB — BASIC METABOLIC PANEL
ANION GAP: 7 (ref 5–15)
BUN: 7 mg/dL (ref 6–20)
CHLORIDE: 107 mmol/L (ref 101–111)
CO2: 21 mmol/L — AB (ref 22–32)
Calcium: 8.6 mg/dL — ABNORMAL LOW (ref 8.9–10.3)
Creatinine, Ser: 0.52 mg/dL (ref 0.44–1.00)
GFR calc non Af Amer: >60 mL/min (ref >60)
Glucose, Bld: 122 mg/dL — ABNORMAL HIGH (ref 65–99)
Potassium: 3.6 mmol/L (ref 3.5–5.1)
Sodium: 135 mmol/L (ref 135–145)

## 2015-11-20 LAB — HCG, QUANTITATIVE, PREGNANCY: hCG, Beta Chain, Quant, S: 67817 m[IU]/mL — ABNORMAL HIGH (ref <5)

## 2015-11-20 NOTE — ED Triage Notes (Signed)
12 weeks pregant, vaginal bleeding began 3 am today, has had bleeding this preg but this is heavier, more than a period. 3 pads this am.

## 2015-11-20 NOTE — ED Provider Notes (Signed)
[facility] Emergency Department Provider Note  Time seen: 7:26 AM  I have reviewed the triage vital signs and the nursing notes.   HISTORY  Chief Complaint Vaginal Bleeding    HPI Gardner Candleeniesh S Ek is a 35 y.o. female approximately [redacted] weeks pregnant who presents the emergency department with vaginal bleeding. According to the patient she has had multiple issues this pregnancy, including vaginal bleeding 3 weeks ago, laparoscopic removal of ovarian cysts 2 months ago. She states she has not had bleeding for 3 weeks however woke up this morning with bleeding more so than she had been experiencing a month ago. Patient states mild lower abdominal cramping, denies nausea, vomiting, fever, dysuria.  Past Medical History:  Diagnosis Date  . Migraines   . Syncope     Patient Active Problem List   Diagnosis Date Noted  . First trimester screening 11/18/2015  . Right ovarian cyst 10/03/2015  . Post-operative state 10/03/2015    Past Surgical History:  Procedure Laterality Date  . LAPAROSCOPIC SALPINGO OOPHERECTOMY Right 10/03/2015   Procedure:  D&C,right ovarian cystectomy laparoscopic ,laparoscopic excision right ovarian mass;  Surgeon: Suzy Bouchardhomas J Schermerhorn, MD;  Location: [facility] ORS;  Service: Gynecology;  Laterality: Right;    Prior to Admission medications   Medication Sig Start Date End Date Taking? Authorizing Provider  Prenatal Vit-Fe Fumarate-FA (PRENATAL MULTIVITAMIN) TABS tablet Take 1 tablet by mouth daily at 12 noon.    Historical Provider, MD  progesterone (PROMETRIUM) 200 MG capsule Take 200 mg by mouth daily.    Historical Provider, MD    No Known Allergies  No family history on file.  Social History Social History  Substance Use Topics  . Smoking status: Current Every Day Smoker    Packs/day: 0.25    Types: Cigarettes  . Smokeless tobacco: Never Used  . Alcohol use No    Review of Systems Constitutional: Negative for  fever. Cardiovascular: Negative for chest pain. Respiratory: Negative for shortness of breath. Gastrointestinal: Mild lower abdominal cramping. Genitourinary: Negative for dysuria. Neurological: Negative for headache 10-point ROS otherwise negative.  ____________________________________________   PHYSICAL EXAM:  VITAL SIGNS: ED Triage Vitals  Enc Vitals Group     BP 11/20/15 0715 109/72     Pulse Rate 11/20/15 0715 82     Resp 11/20/15 0715 18     Temp 11/20/15 0715 98.4 F (36.9 C)     Temp Source 11/20/15 0715 Oral     SpO2 11/20/15 0715 99 %     Weight 11/20/15 0716 143 lb (64.9 kg)     Height 11/20/15 0716 5\' 6"  (1.676 m)     Head Circumference --      Peak Flow --      Pain Score 11/20/15 0716 8     Pain Loc --      Pain Edu? --      Excl. in GC? --    Constitutional: Alert and oriented. Well appearing and in no distress. Eyes: Normal exam ENT   Head: Normocephalic and atraumatic   Mouth/Throat: Mucous membranes are moist. Cardiovascular: Normal rate, regular rhythm. No murmur Respiratory: Normal respiratory effort without tachypnea nor retractions. Breath sounds are clear Gastrointestinal: Soft, minimal suprapubic tenderness, no rebound or guarding. Musculoskeletal: Nontender with normal range of motion in all extremities.  Neurologic:  Normal speech and language. No gross focal neurologic deficits  Skin:  Skin is warm, dry and intact.  Psychiatric: Mood and affect are normal. Speech and behavior are normal.  ____________________________________________   INITIAL IMPRESSION / ASSESSMENT AND PLAN / ED COURSE  Pertinent labs & imaging results that were available during my care of the patient were reviewed by me and considered in my medical decision making (see chart for details).  The patient presents the emergency department with vaginal bleeding approximately [redacted] weeks pregnant. We will check labs, obtain ultrasound and closely monitor in the emergency  department. Patient has a recorded what type of O+ in the emergency department within the last 1 month.  Patient had an ultrasound performed yesterday. I used a bedside ultrasound, patient has normal fetal heart tones are 150 bpm, great fetal movement. We will check labs have the patient follow up with her OB/GYN on Monday. Patient is agreeable to this plan.  Labs are largely unrevealing. We'll have the patient follow up with her OB/GYN on Monday for reevaluation.  ____________________________________________   FINAL CLINICAL IMPRESSION(S) / ED DIAGNOSES  Threatened miscarriage    Minna AntisKevin Navy Belay, MD 11/20/15 [phone]

## 2015-11-25 ENCOUNTER — Telehealth: Payer: Self-pay | Admitting: Obstetrics and Gynecology

## 2015-11-25 NOTE — Telephone Encounter (Signed)
   Ms. Nancy Watson elected to undergo First Trimester screening as a part of her routine prenatal care on 11/18/2015.  To review, first trimester screening, includes nuchal translucency ultrasound screen and/or first trimester maternal serum marker screening.  The nuchal translucency has approximately an 80% detection rate for Down syndrome and can be positive for other chromosome abnormalities as well as heart defects.  When combined with a maternal serum marker screening, the detection rate is up to 90% for Down syndrome and up to 97% for trisomy 13 and 18.     The results of the First Trimester Nuchal Translucency and Biochemical Screening were within normal range for aneuploidy.  The risk for Down syndrome is now estimated to be 1 in 752.  The risk for Trisomy 13/18 is 1 in 4,952.  Should more definitive information be desired, we would offer amniocentesis.  Because we do not yet know the effectiveness of combined first and second trimester screening, we do not recommend a maternal serum screen to assess the chance for chromosome conditions.  However, if screening for neural tube defects is desired, maternal serum screening for AFP only can be performed between 15 and [redacted] weeks gestation.    Of note, the PAPP-A level was at the 2nd percentile.  PAPP-A results at or below the 5th percentile have been associated with and increased chance for adverse obstetrical outcomes, including preeclampsia.  For this reason, a third trimester ultrasound for growth could be considered.  Please contact our office at [phone] with any questions or concerns.  Nancy Andersoneborah F. Dorismar Chay, MS, CGC

## 2015-11-29 ENCOUNTER — Emergency Department
Admission: EM | Admit: 2015-11-29 | Discharge: 2015-11-29 | Disposition: A | Discharge status: Home / Self Care | Payer: Medicaid Other | Attending: Emergency Medicine | Admitting: Emergency Medicine

## 2015-11-29 ENCOUNTER — Encounter: Payer: Self-pay | Admitting: Emergency Medicine

## 2015-11-29 DIAGNOSIS — F17210 Nicotine dependence, cigarettes, uncomplicated: Secondary | ICD-10-CM | POA: Diagnosis not present

## 2015-11-29 DIAGNOSIS — Z5321 Procedure and treatment not carried out due to patient leaving prior to being seen by health care provider: Secondary | ICD-10-CM | POA: Diagnosis not present

## 2015-11-29 DIAGNOSIS — Z3A00 Weeks of gestation of pregnancy not specified: Secondary | ICD-10-CM | POA: Diagnosis not present

## 2015-11-29 DIAGNOSIS — O26899 Other specified pregnancy related conditions, unspecified trimester: Secondary | ICD-10-CM | POA: Insufficient documentation

## 2015-11-29 DIAGNOSIS — R109 Unspecified abdominal pain: Secondary | ICD-10-CM | POA: Insufficient documentation

## 2015-11-29 DIAGNOSIS — O99330 Smoking (tobacco) complicating pregnancy, unspecified trimester: Secondary | ICD-10-CM | POA: Diagnosis not present

## 2015-11-29 LAB — CBC
HCT: 34.3 % — ABNORMAL LOW (ref 35.0–47.0)
HEMOGLOBIN: 12 g/dL (ref 12.0–16.0)
MCH: 29.4 pg (ref 26.0–34.0)
MCHC: 34.9 g/dL (ref 32.0–36.0)
MCV: 84 fL (ref 80.0–100.0)
Platelets: 297 10*3/uL (ref 150–440)
RBC: 4.08 MIL/uL (ref 3.80–5.20)
RDW: 13.7 % (ref 11.5–14.5)
WBC: 5.7 10*3/uL (ref 3.6–11.0)

## 2015-11-29 LAB — COMPREHENSIVE METABOLIC PANEL
ALK PHOS: 48 U/L (ref 38–126)
ALT: 11 U/L — AB (ref 14–54)
ANION GAP: 7 (ref 5–15)
AST: 17 U/L (ref 15–41)
Albumin: 4 g/dL (ref 3.5–5.0)
BILIRUBIN TOTAL: 0.3 mg/dL (ref 0.3–1.2)
BUN: 9 mg/dL (ref 6–20)
CALCIUM: 9 mg/dL (ref 8.9–10.3)
CO2: 20 mmol/L — AB (ref 22–32)
CREATININE: 0.35 mg/dL — AB (ref 0.44–1.00)
Chloride: 107 mmol/L (ref 101–111)
Glucose, Bld: 87 mg/dL (ref 65–99)
Potassium: 3.8 mmol/L (ref 3.5–5.1)
Sodium: 134 mmol/L — ABNORMAL LOW (ref 135–145)
TOTAL PROTEIN: 7.3 g/dL (ref 6.5–8.1)

## 2015-11-29 LAB — LIPASE, BLOOD: Lipase: 25 U/L (ref 11–51)

## 2015-11-29 LAB — HCG, QUANTITATIVE, PREGNANCY: hCG, Beta Chain, Quant, S: 65928 m[IU]/mL — ABNORMAL HIGH (ref <5)

## 2015-11-29 NOTE — ED Provider Notes (Signed)
Patient eloped prior to my evaluation   Procedures    Nancy FilbertJonathan E Williams, MD 11/29/15 2227

## 2015-11-29 NOTE — ED Triage Notes (Addendum)
C/O abdominal pain since Friday.  Seen by OBGYN this morning and was referred to ED for evaluation.  [facility]  Patient states had some vaginal bleeding yesterday, but bleeding has stopped.  P6 G3 A2.

## 2015-12-07 ENCOUNTER — Emergency Department
Admission: EM | Admit: 2015-12-07 | Discharge: 2015-12-07 | Disposition: A | Discharge status: Home / Self Care | Payer: Medicaid Other | Attending: Emergency Medicine | Admitting: Emergency Medicine

## 2015-12-07 ENCOUNTER — Emergency Department: Payer: Medicaid Other

## 2015-12-07 ENCOUNTER — Encounter: Payer: Self-pay | Admitting: Medical Oncology

## 2015-12-07 DIAGNOSIS — O99332 Smoking (tobacco) complicating pregnancy, second trimester: Secondary | ICD-10-CM | POA: Diagnosis not present

## 2015-12-07 DIAGNOSIS — O200 Threatened abortion: Secondary | ICD-10-CM

## 2015-12-07 DIAGNOSIS — Z3A15 15 weeks gestation of pregnancy: Secondary | ICD-10-CM | POA: Insufficient documentation

## 2015-12-07 DIAGNOSIS — F17210 Nicotine dependence, cigarettes, uncomplicated: Secondary | ICD-10-CM | POA: Insufficient documentation

## 2015-12-07 DIAGNOSIS — O4692 Antepartum hemorrhage, unspecified, second trimester: Secondary | ICD-10-CM | POA: Diagnosis present

## 2015-12-07 LAB — URINALYSIS COMPLETE WITH MICROSCOPIC (ARMC ONLY)
BACTERIA UA: NONE SEEN
BILIRUBIN URINE: NEGATIVE
Glucose, UA: NEGATIVE mg/dL
KETONES UR: NEGATIVE mg/dL
Leukocytes, UA: NEGATIVE
NITRITE: NEGATIVE
PH: 7 (ref 5.0–8.0)
PROTEIN: NEGATIVE mg/dL
Specific Gravity, Urine: 1.017 (ref 1.005–1.030)
WBC UA: NONE SEEN WBC/hpf (ref 0–5)

## 2015-12-07 LAB — COMPREHENSIVE METABOLIC PANEL
ALT: 9 U/L — AB (ref 14–54)
AST: 17 U/L (ref 15–41)
Albumin: 3.6 g/dL (ref 3.5–5.0)
Alkaline Phosphatase: 41 U/L (ref 38–126)
Anion gap: 3 — ABNORMAL LOW (ref 5–15)
BUN: 6 mg/dL (ref 6–20)
CHLORIDE: 108 mmol/L (ref 101–111)
CO2: 22 mmol/L (ref 22–32)
CREATININE: 0.5 mg/dL (ref 0.44–1.00)
Calcium: 8.5 mg/dL — ABNORMAL LOW (ref 8.9–10.3)
GFR calc Af Amer: >60 mL/min (ref >60)
Glucose, Bld: 86 mg/dL (ref 65–99)
POTASSIUM: 3.9 mmol/L (ref 3.5–5.1)
SODIUM: 133 mmol/L — AB (ref 135–145)
Total Bilirubin: 0.4 mg/dL (ref 0.3–1.2)
Total Protein: 6.7 g/dL (ref 6.5–8.1)

## 2015-12-07 LAB — CBC WITH DIFFERENTIAL/PLATELET
BASOS ABS: 0 10*3/uL (ref 0–0.1)
BASOS PCT: 1 %
EOS ABS: 0.2 10*3/uL (ref 0–0.7)
EOS PCT: 4 %
HCT: 31.3 % — ABNORMAL LOW (ref 35.0–47.0)
Hemoglobin: 10.8 g/dL — ABNORMAL LOW (ref 12.0–16.0)
LYMPHS PCT: 38 %
Lymphs Abs: 2 10*3/uL (ref 1.0–3.6)
MCH: 28.8 pg (ref 26.0–34.0)
MCHC: 34.6 g/dL (ref 32.0–36.0)
MCV: 83.2 fL (ref 80.0–100.0)
MONO ABS: 0.4 10*3/uL (ref 0.2–0.9)
Monocytes Relative: 8 %
Neutro Abs: 2.6 10*3/uL (ref 1.4–6.5)
Neutrophils Relative %: 49 %
PLATELETS: 267 10*3/uL (ref 150–440)
RBC: 3.76 MIL/uL — AB (ref 3.80–5.20)
RDW: 13.9 % (ref 11.5–14.5)
WBC: 5.2 10*3/uL (ref 3.6–11.0)

## 2015-12-07 LAB — HCG, QUANTITATIVE, PREGNANCY: hCG, Beta Chain, Quant, S: 40775 m[IU]/mL — ABNORMAL HIGH (ref <5)

## 2015-12-07 NOTE — ED Notes (Addendum)
Pt to ED this morning after having episode of bleeding this morning while doing normal activity.  She is approx [redacted] weeks pregnant.  Pt states blood felt like it "gushed out" when she sat down on the toilet and has soaked 2 pads since then.  Pt also reports lower abdominal cramping.

## 2015-12-07 NOTE — ED Provider Notes (Signed)
[facility] Emergency Department Provider Note        Time seen: ----------------------------------------- 9:47 AM on 12/07/2015 -----------------------------------------    I have reviewed the triage vital signs and the nursing notes.   HISTORY  Chief Complaint Vaginal Bleeding    HPI Nancy Watson is a 35 y.o. female who presents to the ER stating she is around 14 or [redacted] weeks pregnant with vaginal bleeding. Patient states she is G6, P 3, AB 2.She has also had low back pain with abdominal cramping. She has bled on and off this pregnancy, has not had this problem before. She denies recent illness or other complaints.   Past Medical History:  Diagnosis Date  . Migraines   . Syncope     Patient Active Problem List   Diagnosis Date Noted  . First trimester screening 11/18/2015  . Right ovarian cyst 10/03/2015  . Post-operative state 10/03/2015    Past Surgical History:  Procedure Laterality Date  . LAPAROSCOPIC SALPINGO OOPHERECTOMY Right 10/03/2015   Procedure:  D&C,right ovarian cystectomy laparoscopic ,laparoscopic excision right ovarian mass;  Surgeon: Suzy Bouchardhomas J Schermerhorn, MD;  Location: [facility] ORS;  Service: Gynecology;  Laterality: Right;    Allergies Review of patient's allergies indicates no known allergies.  Social History Social History  Substance Use Topics  . Smoking status: Current Every Day Smoker    Packs/day: 0.25    Types: Cigarettes  . Smokeless tobacco: Never Used  . Alcohol use No    Review of Systems Constitutional: Negative for fever. Cardiovascular: Negative for chest pain. Respiratory: Negative for shortness of breath. Gastrointestinal: Positive for abdominal pain Genitourinary: Negative for dysuria.Positive for vaginal bleeding Musculoskeletal: Positive for low back pain Skin: Negative for rash. Neurological: Negative for headaches, focal weakness or numbness.  10-point ROS otherwise  negative.  ____________________________________________   PHYSICAL EXAM:  VITAL SIGNS: ED Triage Vitals  Enc Vitals Group     BP 12/07/15 0940 111/63     Pulse Rate 12/07/15 0940 81     Resp 12/07/15 0940 17     Temp 12/07/15 0940 98.5 F (36.9 C)     Temp Source 12/07/15 0940 Oral     SpO2 12/07/15 0940 100 %     Weight 12/07/15 0941 141 lb (64 kg)     Height 12/07/15 0941 5\' 6"  (1.676 m)     Head Circumference --      Peak Flow --      Pain Score 12/07/15 0941 7     Pain Loc --      Pain Edu? --      Excl. in GC? --     Constitutional: Alert and oriented. Well appearing and in no distress. Eyes: Conjunctivae are normal. PERRL. Normal extraocular movements. ENT   Head: Normocephalic and atraumatic.   Nose: No congestion/rhinnorhea.   Mouth/Throat: Mucous membranes are moist.   Neck: No stridor. Cardiovascular: Normal rate, regular rhythm. No murmurs, rubs, or gallops. Respiratory: Normal respiratory effort without tachypnea nor retractions. Breath sounds are clear and equal bilaterally. No wheezes/rales/rhonchi. Gastrointestinal: Soft and nontender. Normal bowel sounds Musculoskeletal: Nontender with normal range of motion in all extremities. No lower extremity tenderness nor edema. Neurologic:  Normal speech and language. No gross focal neurologic deficits are appreciated.  Skin:  Skin is warm, dry and intact. No rash noted. Psychiatric: Mood and affect are normal. Speech and behavior are normal.  ____________________________________________  ED COURSE:  Pertinent labs & imaging results that were available during my  care of the patient were reviewed by me and considered in my medical decision making (see chart for details). Clinical Course  Patient presents to ER with vaginal bleeding in the second trimester, we will assess with labs and ultrasound.  Procedures ____________________________________________   LABS (pertinent positives/negatives)  Labs  Reviewed  CBC WITH DIFFERENTIAL/PLATELET - Abnormal; Notable for the following:       Result Value   RBC 3.76 (*)    Hemoglobin 10.8 (*)    HCT 31.3 (*)    All other components within normal limits  COMPREHENSIVE METABOLIC PANEL - Abnormal; Notable for the following:    Sodium 133 (*)    Calcium 8.5 (*)    ALT 9 (*)    Anion gap 3 (*)    All other components within normal limits  URINALYSIS COMPLETEWITH MICROSCOPIC ([facility] ONLY) - Abnormal; Notable for the following:    Color, Urine YELLOW (*)    APPearance CLOUDY (*)    Hgb urine dipstick 2+ (*)    Squamous Epithelial / LPF 6-30 (*)    All other components within normal limits  HCG, QUANTITATIVE, PREGNANCY    RADIOLOGY  Pregnancy ultrasound IMPRESSION: Single viable injury pregnancy at 15 weeks 2 days. Questionable chorioamniotic separation. Follow-up exam suggested.  These results will be called to the ordering clinician or representative by the Radiologist Assistant, and communication documented in the PACS or zVision Dashboard.  This exam is performed on an emergent basis and does not comprehensively evaluate fetal size, dating, or anatomy; follow-up complete OB US should be considered if further fetal assessment is warranted.  ____________________________________________  FINAL ASSESSMENT AND PLAN  Threatened miscarriage  Plan: Patient with labs and imaging as dictated above. Patient with ultrasound findings as dictated above. She will have close outpatient follow-up with her OB/GYN doctor.   Emily Filbert, MD   Note: This dictation was prepared with Dragon dictation. Any transcriptional errors that result from this process are unintentional    Emily Filbert, MD 12/07/15 1148

## 2015-12-07 NOTE — ED Triage Notes (Signed)
Pt has EDD of 06/01/16 and is 14w 5D preg, reports vaginal bleeding and lower back pain that began this am.

## 2016-01-12 NOTE — Telephone Encounter (Signed)
Medication not refilled by Dr. Nona Dellonald Chris Cripps

## 2016-01-31 ENCOUNTER — Observation Stay
Admission: EM | Admit: 2016-01-31 | Discharge: 2016-01-31 | Disposition: A | Discharge status: Home / Self Care | Payer: Medicaid Other | Attending: Obstetrics & Gynecology | Admitting: Obstetrics & Gynecology

## 2016-01-31 DIAGNOSIS — F17210 Nicotine dependence, cigarettes, uncomplicated: Secondary | ICD-10-CM | POA: Insufficient documentation

## 2016-01-31 DIAGNOSIS — Z3A22 22 weeks gestation of pregnancy: Secondary | ICD-10-CM | POA: Diagnosis not present

## 2016-01-31 DIAGNOSIS — O26892 Other specified pregnancy related conditions, second trimester: Secondary | ICD-10-CM | POA: Diagnosis not present

## 2016-01-31 DIAGNOSIS — R102 Pelvic and perineal pain: Secondary | ICD-10-CM | POA: Diagnosis present

## 2016-01-31 LAB — URINALYSIS COMPLETE WITH MICROSCOPIC (ARMC ONLY)
Bilirubin Urine: NEGATIVE
Glucose, UA: NEGATIVE mg/dL
HGB URINE DIPSTICK: NEGATIVE
KETONES UR: NEGATIVE mg/dL
LEUKOCYTES UA: NEGATIVE
Nitrite: NEGATIVE
PH: 7 (ref 5.0–8.0)
PROTEIN: NEGATIVE mg/dL
SPECIFIC GRAVITY, URINE: 1.019 (ref 1.005–1.030)

## 2016-01-31 MED ORDER — ACETAMINOPHEN 500 MG PO TABS
1000.0000 mg | ORAL_TABLET | ORAL | Status: DC | PRN
  Administered 2016-01-31: 1000 mg via ORAL
  Filled 2016-01-31: qty 2

## 2016-01-31 NOTE — Plan of Care (Signed)
Pt presents to l/d with c/o lower abdominal pain that started last night. Pt denies vaginal bleeding or leaking fluid. Marker given to pt to push when she has pain

## 2016-01-31 NOTE — OB Triage Note (Signed)
See note

## 2016-01-31 NOTE — Discharge Summary (Signed)
Nancy Watson is a 35 y.o. female. She is at 2474w4d gestation.  Chief Complaint: abdominal;/pelvic pain  S: Resting comfortably. no CTX, no VB.no LOF,  Active fetal movement.   Location: low abdomen, midline Context: patient has had this pain for a few days now, does not radiate Onset/timing/duration: constant, does not radiate Quality: ache Severity:  moderate Aggravating factors: movement Alleviating factors: rest, but not really Associated signs/symptoms: no fever, chills, contractions, LOF VB +FM    Maternal Medical History:   Past Medical History:  Diagnosis Date  . Migraines   . Syncope     Past Surgical History:  Procedure Laterality Date  . LAPAROSCOPIC SALPINGO OOPHERECTOMY Right 10/03/2015   Procedure:  D&C,right ovarian cystectomy laparoscopic ,laparoscopic excision right ovarian mass;  Surgeon: Suzy Bouchardhomas J Schermerhorn, MD;  Location: [facility] ORS;  Service: Gynecology;  Laterality: Right;    No Known Allergies  Prior to Admission medications   Medication Sig Start Date End Date Taking? Authorizing Provider  Prenatal Vit-Fe Fumarate-FA (PRENATAL MULTIVITAMIN) TABS tablet Take 1 tablet by mouth every morning.     Historical Provider, MD  progesterone (PROMETRIUM) 200 MG capsule Take 200 mg by mouth at bedtime.     Historical Provider, MD     [facility] Social History: She  reports that she has been smoking Cigarettes.  She has been smoking about 0.25 packs per day. She has never used smokeless tobacco. She reports that she does not drink alcohol or use drugs.  Family History: family history is not on file.   Review of Systems: A full review of systems was performed and negative except as noted in the HPI.     O:  LMP 08/29/2015 (Approximate)  Results for orders placed or performed during the hospital encounter of 01/31/16 (from the past 48 hour(s))  Urinalysis complete, with microscopic [facility] only)   Collection Time: 01/31/16   5:08 PM  Result Value Ref Range   Color, Urine YELLOW (A) YELLOW   APPearance CLEAR (A) CLEAR   Glucose, UA NEGATIVE NEGATIVE mg/dL   Bilirubin Urine NEGATIVE NEGATIVE   Ketones, ur NEGATIVE NEGATIVE mg/dL   Specific Gravity, Urine 1.019 1.005 - 1.030   Hgb urine dipstick NEGATIVE NEGATIVE   pH 7.0 5.0 - 8.0   Protein, ur NEGATIVE NEGATIVE mg/dL   Nitrite NEGATIVE NEGATIVE   Leukocytes, UA NEGATIVE NEGATIVE   RBC / HPF 0-5 0 - 5 RBC/hpf   WBC, UA 0-5 0 - 5 WBC/hpf   Bacteria, UA RARE (A) NONE SEEN   Squamous Epithelial / LPF 0-5 (A) NONE SEEN   Mucous PRESENT      Constitutional: NAD, AAOx3  HE/ENT: extraocular movements grossly intact, moist mucous membranes CV: RRR PULM: nl respiratory effort, CTABL     Abd: gravid, tender with left pelvic palpation with pain in midline, non-distended, soft     Ext: Non-tender, Nonedmeatous   Psych: mood appropriate, speech normal Pelvic:  FHT: 145 TOCO: quiet    A/P:  35yo [id number] @ 22.4 with low abdominal midline pain with palpation on left pelvis  Labor: not present.   Fetal Wellbeing: no decelerations on strip, no contractions.  Has not yet tried tylenol for pain relief, will give now.  Could be scar tissue from cystectomy?    D/c home stable, precautions reviewed, follow-up as scheduled.   ----- Ranae Plumberhelsea Ward, MD Attending Obstetrician and Gynecologist [facility], Department of OB/GYN [facility]

## 2016-02-27 ENCOUNTER — Encounter: Payer: Self-pay | Admitting: *Deleted

## 2016-02-27 ENCOUNTER — Observation Stay
Admission: EM | Admit: 2016-02-27 | Discharge: 2016-02-27 | Disposition: A | Discharge status: Home / Self Care | Payer: Medicaid Other | Attending: Obstetrics and Gynecology | Admitting: Obstetrics and Gynecology

## 2016-02-27 DIAGNOSIS — O26892 Other specified pregnancy related conditions, second trimester: Principal | ICD-10-CM | POA: Insufficient documentation

## 2016-02-27 DIAGNOSIS — O26899 Other specified pregnancy related conditions, unspecified trimester: Secondary | ICD-10-CM | POA: Diagnosis present

## 2016-02-27 DIAGNOSIS — M545 Low back pain: Secondary | ICD-10-CM | POA: Diagnosis not present

## 2016-02-27 DIAGNOSIS — R197 Diarrhea, unspecified: Secondary | ICD-10-CM | POA: Diagnosis not present

## 2016-02-27 DIAGNOSIS — R109 Unspecified abdominal pain: Secondary | ICD-10-CM

## 2016-02-27 DIAGNOSIS — Z3A26 26 weeks gestation of pregnancy: Secondary | ICD-10-CM | POA: Diagnosis not present

## 2016-02-27 LAB — URINALYSIS COMPLETE WITH MICROSCOPIC (ARMC ONLY)
BACTERIA UA: NONE SEEN
Bilirubin Urine: NEGATIVE
Glucose, UA: NEGATIVE mg/dL
Hgb urine dipstick: NEGATIVE
Ketones, ur: NEGATIVE mg/dL
Leukocytes, UA: NEGATIVE
NITRITE: NEGATIVE
PH: 8 (ref 5.0–8.0)
PROTEIN: NEGATIVE mg/dL
SPECIFIC GRAVITY, URINE: 1.017 (ref 1.005–1.030)
WBC UA: NONE SEEN WBC/hpf (ref 0–5)

## 2016-02-27 MED ORDER — LOPERAMIDE HCL 2 MG PO CAPS
2.0000 mg | ORAL_CAPSULE | Freq: Once | ORAL | Status: AC
  Administered 2016-02-27: 2 mg via ORAL

## 2016-02-27 MED ORDER — LOPERAMIDE HCL 2 MG PO CAPS
ORAL_CAPSULE | ORAL | Status: AC
  Administered 2016-02-27: 2 mg via ORAL
  Filled 2016-02-27: qty 1

## 2016-02-27 NOTE — Discharge Instructions (Signed)
[facility] for December [facility].  If any questions or concerns arise please call the office or on call physician.  Drink plenty of water to stay hydrated.

## 2016-02-27 NOTE — Discharge Summary (Signed)
  Suzy Bouchardhomas J Seneca Gadbois, MD  Obstetrics    [] Hide copied text [] Hover for attribution information Patient ID: Nancy Watson, female   DOB: 11/17/1980, 35 y.o.   MRN: [id number] Nancy Watson 08/02/1980 G6 P3 3760w3d presents for one day hx of achey , lbp, diarrhea. Child has been sick . Pt denies fever noLOF , no vaginal bleeding , O;BP (!) 109/58 (BP Location: Left Arm)   Pulse 87   Temp 98.5 F (36.9 C) (Oral)   Resp 16   Ht 5\' 6"  (1.676 m)   Wt 143 lb (64.9 kg)   LMP 08/29/2015 (Approximate)   BMI 23.08 kg/m  ABDsoft Nt , no rebound  CX long + closed  NSTreassuring for 26 weeks  Labs: ua neg  A: viral symptoms c/w viral GE   P:imodium  Supportive care tylenol and  fluids . [facility] if fever  Or pain worsens     Electronically signed by Ihor Austinhomas J Deon Duer

## 2016-02-27 NOTE — Progress Notes (Signed)
Patient ID: Nancy Watson, female   DOB: 04/24/1980, 35 y.o.   MRN: [id number] Nancy Watson 03/04/1981 G6 P3 4546w3d presents for one day hx of achey , lbp, diarrhea. Child has been sick . Pt denies fever noLOF , no vaginal bleeding , O;BP (!) 109/58 (BP Location: Left Arm)   Pulse 87   Temp 98.5 F (36.9 C) (Oral)   Resp 16   Ht 5\' 6"  (1.676 m)   Wt 143 lb (64.9 kg)   LMP 08/29/2015 (Approximate)   BMI 23.08 kg/m  ABDsoft Nt , no rebound  CX long + closed  NSTreassuring for 26 weeks  Labs: ua neg  A: viral symptoms c/w viral GE   P:imodium  Supportive care tylenol and  fluids . [facility] if fever  Or pain worsens

## 2016-02-27 NOTE — OB Triage Note (Signed)
Patient from ED to OBS 4 for complaints of abdominal that started this morning when she woke up.  She states that she just felt bad and was weak this morning. Expresses that her pain is greater on the right but present bilaterally.  Pain is intermittent and radiates to both sides then to the back.  Patient denies any burning or pain with urination.  Denies sexual intercourse since last week. No chills or fever.  Complains of a watery discharge that started this morning.  States that her daughter has been sick with a cold over the past week.

## 2016-03-07 ENCOUNTER — Observation Stay
Admission: EM | Admit: 2016-03-07 | Discharge: 2016-03-08 | Disposition: A | Discharge status: Home / Self Care | Payer: Medicaid Other | Attending: Obstetrics and Gynecology | Admitting: Obstetrics and Gynecology

## 2016-03-07 ENCOUNTER — Encounter: Payer: Self-pay | Admitting: Emergency Medicine

## 2016-03-07 ENCOUNTER — Observation Stay: Payer: Medicaid Other

## 2016-03-07 DIAGNOSIS — F1290 Cannabis use, unspecified, uncomplicated: Secondary | ICD-10-CM | POA: Insufficient documentation

## 2016-03-07 DIAGNOSIS — O98312 Other infections with a predominantly sexual mode of transmission complicating pregnancy, second trimester: Secondary | ICD-10-CM | POA: Insufficient documentation

## 2016-03-07 DIAGNOSIS — O99012 Anemia complicating pregnancy, second trimester: Secondary | ICD-10-CM | POA: Diagnosis not present

## 2016-03-07 DIAGNOSIS — A6000 Herpesviral infection of urogenital system, unspecified: Secondary | ICD-10-CM | POA: Diagnosis not present

## 2016-03-07 DIAGNOSIS — Z3A27 27 weeks gestation of pregnancy: Secondary | ICD-10-CM | POA: Insufficient documentation

## 2016-03-07 DIAGNOSIS — O4702 False labor before 37 completed weeks of gestation, second trimester: Secondary | ICD-10-CM

## 2016-03-07 DIAGNOSIS — O26892 Other specified pregnancy related conditions, second trimester: Secondary | ICD-10-CM | POA: Diagnosis present

## 2016-03-07 DIAGNOSIS — Z369 Encounter for antenatal screening, unspecified: Secondary | ICD-10-CM

## 2016-03-07 DIAGNOSIS — O99332 Smoking (tobacco) complicating pregnancy, second trimester: Secondary | ICD-10-CM | POA: Diagnosis not present

## 2016-03-07 LAB — CBC
HEMATOCRIT: 29 % — AB (ref 35.0–47.0)
HEMOGLOBIN: 10.1 g/dL — AB (ref 12.0–16.0)
MCH: 29.4 pg (ref 26.0–34.0)
MCHC: 34.7 g/dL (ref 32.0–36.0)
MCV: 84.8 fL (ref 80.0–100.0)
Platelets: 357 10*3/uL (ref 150–440)
RBC: 3.42 MIL/uL — AB (ref 3.80–5.20)
RDW: 13.3 % (ref 11.5–14.5)
WBC: 8.4 10*3/uL (ref 3.6–11.0)

## 2016-03-07 LAB — BASIC METABOLIC PANEL
ANION GAP: 7 (ref 5–15)
BUN: 7 mg/dL (ref 6–20)
CO2: 22 mmol/L (ref 22–32)
Calcium: 8.5 mg/dL — ABNORMAL LOW (ref 8.9–10.3)
Chloride: 106 mmol/L (ref 101–111)
Creatinine, Ser: 0.49 mg/dL (ref 0.44–1.00)
GFR calc non Af Amer: >60 mL/min (ref >60)
GLUCOSE: 72 mg/dL (ref 65–99)
POTASSIUM: 3.8 mmol/L (ref 3.5–5.1)
Sodium: 135 mmol/L (ref 135–145)

## 2016-03-07 LAB — CHLAMYDIA/NGC RT PCR (ARMC ONLY)
Chlamydia Tr: NOT DETECTED
N GONORRHOEAE: NOT DETECTED

## 2016-03-07 LAB — FETAL FIBRONECTIN: FETAL FIBRONECTIN: NEGATIVE

## 2016-03-07 LAB — URINE DRUG SCREEN, QUALITATIVE (ARMC ONLY)
AMPHETAMINES, UR SCREEN: NOT DETECTED
Barbiturates, Ur Screen: NOT DETECTED
Benzodiazepine, Ur Scrn: NOT DETECTED
Cannabinoid 50 Ng, Ur ~~LOC~~: POSITIVE — AB
Cocaine Metabolite,Ur ~~LOC~~: NOT DETECTED
MDMA (ECSTASY) UR SCREEN: NOT DETECTED
METHADONE SCREEN, URINE: NOT DETECTED
OPIATE, UR SCREEN: NOT DETECTED
PHENCYCLIDINE (PCP) UR S: NOT DETECTED
Tricyclic, Ur Screen: NOT DETECTED

## 2016-03-07 LAB — URINALYSIS, ROUTINE W REFLEX MICROSCOPIC
Bilirubin Urine: NEGATIVE
Glucose, UA: NEGATIVE mg/dL
Hgb urine dipstick: NEGATIVE
Ketones, ur: NEGATIVE mg/dL
LEUKOCYTES UA: NEGATIVE
NITRITE: NEGATIVE
Protein, ur: NEGATIVE mg/dL
SPECIFIC GRAVITY, URINE: 1.02 (ref 1.005–1.030)
pH: 7 (ref 5.0–8.0)

## 2016-03-07 LAB — WET PREP, GENITAL
Clue Cells Wet Prep HPF POC: NONE SEEN
Sperm: NONE SEEN
TRICH WET PREP: NONE SEEN
Yeast Wet Prep HPF POC: NONE SEEN

## 2016-03-07 MED ORDER — BETAMETHASONE SOD PHOS & ACET 6 (3-3) MG/ML IJ SUSP
12.0000 mg | Freq: Once | INTRAMUSCULAR | Status: AC
  Administered 2016-03-08: 12 mg via INTRAMUSCULAR
  Filled 2016-03-07: qty 2

## 2016-03-07 MED ORDER — LACTATED RINGERS IV SOLN
500.0000 mL | INTRAVENOUS | Status: DC | PRN
  Administered 2016-03-07: 1000 mL via INTRAVENOUS

## 2016-03-07 MED ORDER — TERBUTALINE SULFATE 1 MG/ML IJ SOLN
0.2500 mg | Freq: Once | INTRAMUSCULAR | Status: AC
  Administered 2016-03-08: 0.25 mg via SUBCUTANEOUS
  Filled 2016-03-07: qty 1

## 2016-03-07 MED ORDER — LACTATED RINGERS IV SOLN
INTRAVENOUS | Status: DC
  Administered 2016-03-08: 125 mL/h via INTRAVENOUS

## 2016-03-07 NOTE — Progress Notes (Signed)
S: Pt. Still c/o "crampy" contractions rating them 7/10, but states she does not need pain medication at this time.       Contractions noted every 4-5 minutes after toco adjustment   O:  VS: Blood pressure 97/69, pulse 88, temperature 98.2 F (36.8 C), temperature source Oral, resp. rate 16, height 5\' 6"  (1.676 m), weight 68 kg (150 lb), last menstrual period 08/29/2015, SpO2 100 %.   Results for orders placed or performed during the hospital encounter of 03/07/16 (from the past 24 hour(s))  Fetal fibronectin     Status: None   Collection Time: 03/07/16  7:26 PM  Result Value Ref Range   Fetal Fibronectin NEGATIVE NEGATIVE   Appearance, FETFIB CLEAR CLEAR  Urinalysis, Routine w reflex microscopic     Status: Abnormal   Collection Time: 03/07/16  7:27 PM  Result Value Ref Range   Color, Urine YELLOW (A) YELLOW   APPearance CLOUDY (A) CLEAR   Specific Gravity, Urine 1.020 1.005 - 1.030   pH 7.0 5.0 - 8.0   Glucose, UA NEGATIVE NEGATIVE mg/dL   Hgb urine dipstick NEGATIVE NEGATIVE   Bilirubin Urine NEGATIVE NEGATIVE   Ketones, ur NEGATIVE NEGATIVE mg/dL   Protein, ur NEGATIVE NEGATIVE mg/dL   Nitrite NEGATIVE NEGATIVE   Leukocytes, UA NEGATIVE NEGATIVE  CBC     Status: Abnormal   Collection Time: 03/07/16  7:31 PM  Result Value Ref Range   WBC 8.4 3.6 - 11.0 K/uL   RBC 3.42 (L) 3.80 - 5.20 MIL/uL   Hemoglobin 10.1 (L) 12.0 - 16.0 g/dL   HCT 16.129.0 (L) 09.635.0 - 04.547.0 %   MCV 84.8 80.0 - 100.0 fL   MCH 29.4 26.0 - 34.0 pg   MCHC 34.7 32.0 - 36.0 g/dL   RDW 40.913.3 81.111.5 - 91.414.5 %   Platelets 357 150 - 440 K/uL  Basic metabolic panel     Status: Abnormal   Collection Time: 03/07/16  7:31 PM  Result Value Ref Range   Sodium 135 135 - 145 mmol/L   Potassium 3.8 3.5 - 5.1 mmol/L   Chloride 106 101 - 111 mmol/L   CO2 22 22 - 32 mmol/L   Glucose, Bld 72 65 - 99 mg/dL   BUN 7 6 - 20 mg/dL   Creatinine, Ser 7.820.49 0.44 - 1.00 mg/dL   Calcium 8.5 (L) 8.9 - 10.3 mg/dL   GFR calc non Af Amer >60  >60 mL/min   GFR calc Af Amer >60 >60 mL/min   Anion gap 7 5 - 15  Wet prep, genital     Status: Abnormal   Collection Time: 03/07/16  7:33 PM  Result Value Ref Range   Yeast Wet Prep HPF POC NONE SEEN NONE SEEN   Trich, Wet Prep NONE SEEN NONE SEEN   Clue Cells Wet Prep HPF POC NONE SEEN NONE SEEN   WBC, Wet Prep HPF POC FEW (A) NONE SEEN   Sperm NONE SEEN   Urine Drug Screen, Qualitative ([facility] only)     Status: Abnormal   Collection Time: 03/07/16  7:33 PM  Result Value Ref Range   Tricyclic, Ur Screen NONE DETECTED NONE DETECTED   Amphetamines, Ur Screen NONE DETECTED NONE DETECTED   MDMA (Ecstasy)Ur Screen NONE DETECTED NONE DETECTED   Cocaine Metabolite,Ur [place] NONE DETECTED NONE DETECTED   Opiate, Ur Screen NONE DETECTED NONE DETECTED   Phencyclidine (PCP) Ur S NONE DETECTED NONE DETECTED   Cannabinoid 50 Ng, Ur [place] POSITIVE (A)  NONE DETECTED   Barbiturates, Ur Screen NONE DETECTED NONE DETECTED   Benzodiazepine, Ur Scrn NONE DETECTED NONE DETECTED   Methadone Scn, Ur NONE DETECTED NONE DETECTED  Chlamydia/NGC rt PCR ([facility] only)     Status: None   Collection Time: 03/07/16  9:41 PM  Result Value Ref Range   Specimen source GC/Chlam ENDOCERVICAL    Chlamydia Tr NOT DETECTED NOT DETECTED   N gonorrhoeae NOT DETECTED NOT DETECTED   Cervical length on US: 3.4cm         FHR : baseline 145 bpm / variability moderate / accelerations + / occasional variable decelerations        Toco: contractions every 4-5 minutes         Cervix : internal os: slightly open per BEB exam        Membranes: intact  A: Threatened labor at 27+5 weeks       P: Terbutaline 0.25mg  SQ x 1      BMZ today, and repeat dose tomorrow     Continue IVF     All cultures negative - no evidence of infection      Anemia - will need oral FE at time of discharge     Overnight observation - Monitor closely    Discussed labs and US with Dr. Dalbert GarnetBeasley, and consulted for plan of care and she recommends overnight  observation   Carlean JewsMeredith Elonzo Sopp, CNM

## 2016-03-07 NOTE — OB Triage Provider Note (Signed)
TRIAGE NOTE to rule out Preterm Labor   History of Present Illness: Nancy Watson is a 35 y.o. [id number] at 6740w5d presenting to triage for rule out preterm labor. Pt reports cramping as strong as 9/10 that started this morning. Last intercourse 7 days ago. Three prior term vaginal deliveries.  Patient reports the fetal movement as active. Patient reports uterine contraction  activity as present, not a lot of break inbetween. Patient reports  vaginal bleeding as yesterday, but none today. Patient describes fluid per vagina as Other mucousy discharge. Fetal presentation is unknown.  Patient Active Problem List   Diagnosis Date Noted  . Abdominal pain affecting pregnancy 02/27/2016  . Pelvic pain 01/31/2016  . First trimester screening 11/18/2015  . Right ovarian cyst 10/03/2015  . Post-operative state 10/03/2015    Past Medical History:  Diagnosis Date  . Migraines   . Syncope     Past Surgical History:  Procedure Laterality Date  . DILATION AND CURETTAGE OF UTERUS  09/2015  . LAPAROSCOPIC SALPINGO OOPHERECTOMY Right 10/03/2015   Procedure:  D&C,right ovarian cystectomy laparoscopic ,laparoscopic excision right ovarian mass;  Surgeon: Suzy Bouchardhomas J Schermerhorn, MD;  Location: [facility] ORS;  Service: Gynecology;  Laterality: Right;    OB History  Gravida Para Term Preterm AB Living  6 3 3   2 3   SAB TAB Ectopic Multiple Live Births  1 0     3    # Outcome Date GA Lbr Len/2nd Weight Sex Delivery Anes PTL Lv  6 Current           5 Term 05/07/12    F Vag-Spont   LIV  4 SAB 2013          3 Term 12/21/05    F Vag-Spont   LIV  2 AB 2007          1 Term 10/20/03    M Vag-Spont   LIV      Social History   Social History  . Marital status: Single    Spouse name: N/A  . Number of children: N/A  . Years of education: N/A   Social History Main Topics  . Smoking status: Current Every Day Smoker    Packs/day: 0.25    Types: Cigarettes  . Smokeless tobacco: Never Used  . Alcohol  use No  . Drug use: No  . Sexual activity: Yes   Other Topics Concern  . None   Social History Narrative  . None    History reviewed. No pertinent family history.  No Known Allergies  Prescriptions Prior to Admission  Medication Sig Dispense Refill Last Dose  . Prenatal Vit-Fe Fumarate-FA (PRENATAL MULTIVITAMIN) TABS tablet Take 1 tablet by mouth every morning.    02/27/2016 at Unknown time  . progesterone (PROMETRIUM) 200 MG capsule Take 200 mg by mouth at bedtime.    Not Taking at Unknown time    Review of Systems - See HPI for OB specific ROS.   Vitals:  BP 121/83 (BP Location: Left Arm)   Pulse 92   Temp 98.6 F (37 C) (Oral)   Resp 16   Ht 5\' 6"  (1.676 m)   Wt 150 lb (68 kg)   LMP 08/29/2015 (Approximate)   SpO2 100%   BMI 24.21 kg/m  Physical Examination: CONSTITUTIONAL: Well-developed, well-nourished female in no acute distress.  HENT:  Normocephalic, atraumatic EYES: Conjunctivae and EOM are normal. No scleral icterus.  NECK: Normal range of motion, supple, SKIN: Skin is warm and  dry. No rash noted. Not diaphoretic. No erythema. No pallor. NEUROLGIC: Alert and oriented to person, place, and time. No gross cranial nerve deficit noted. PSYCHIATRIC: Normal mood and affect. Normal behavior. Normal judgment and thought content. CARDIOVASCULAR: Normal heart rate noted, regular rhythm RESPIRATORY: Effort and breath sounds normal, no problems with respiration noted ABDOMEN: Soft, nontender, nondistended, gravid. Non tender fundus.  Cervix: ft internal os, thick and high Membranes:intact Fetal Monitoring: 150, mod var, +10x10 accels, rare variable decels Tocometer: Flat  Labs:  No results found for this or any previous visit (from the past 24 hour(s)).  Imaging Studies: No results found.   Assessment and Plan: Patient Active Problem List   Diagnosis Date Noted  . Abdominal pain affecting pregnancy 02/27/2016  . Pelvic pain 01/31/2016  . First trimester  screening 11/18/2015  . Right ovarian cyst 10/03/2015  . Post-operative state 10/03/2015    1. Pain sounds like preterm contractions, but nothing noted on monitor. Cervix internal os not closed, but 3 prior term deliveries. However, because not closed will get cervical length ultrasound. Fetal fibronectin collected and pending. 2. R/O infection: wet mount, GC/CT, myco and ureaplasm bacteria sent.  3. UA pending. 4. Fluid bolus, CBC for infection, CMP for kidney assessment. 5. Vital normal.  6. F/u labs and ultrasound   Cline CoolsBethany E Adler Chartrand, MD, MPH

## 2016-03-08 ENCOUNTER — Inpatient Hospital Stay
Admission: RE | Admit: 2016-03-08 | Discharge: 2016-03-08 | Disposition: A | Discharge status: Home / Self Care | Payer: Medicaid Other | Attending: Obstetrics and Gynecology | Admitting: Obstetrics and Gynecology

## 2016-03-08 DIAGNOSIS — O26892 Other specified pregnancy related conditions, second trimester: Secondary | ICD-10-CM | POA: Diagnosis not present

## 2016-03-08 DIAGNOSIS — Z3A27 27 weeks gestation of pregnancy: Secondary | ICD-10-CM | POA: Diagnosis not present

## 2016-03-08 DIAGNOSIS — O4702 False labor before 37 completed weeks of gestation, second trimester: Secondary | ICD-10-CM | POA: Insufficient documentation

## 2016-03-08 MED ORDER — SALINE SPRAY 0.65 % NA SOLN
1.0000 | NASAL | Status: DC | PRN
  Administered 2016-03-08: 1 via NASAL
  Filled 2016-03-08: qty 44

## 2016-03-08 MED ORDER — TERBUTALINE SULFATE 1 MG/ML IJ SOLN
0.2500 mg | Freq: Once | INTRAMUSCULAR | Status: AC
  Administered 2016-03-08: 0.25 mg via SUBCUTANEOUS
  Filled 2016-03-08: qty 1

## 2016-03-08 MED ORDER — GUAIFENESIN 100 MG/5ML PO SOLN: 200.0000 mg | ORAL | 0 refills | Status: DC | PRN

## 2016-03-08 MED ORDER — BETAMETHASONE SOD PHOS & ACET 6 (3-3) MG/ML IJ SUSP
12.0000 mg | Freq: Once | INTRAMUSCULAR | Status: AC
  Administered 2016-03-08: 12 mg via INTRAMUSCULAR

## 2016-03-08 MED ORDER — BETAMETHASONE SOD PHOS & ACET 6 (3-3) MG/ML IJ SUSP: 12.0000 mg | Freq: Once | INTRAMUSCULAR | Status: DC

## 2016-03-08 MED ORDER — GUAIFENESIN 100 MG/5ML PO SOLN
200.0000 mg | ORAL | Status: DC | PRN
  Administered 2016-03-08 (×2): 200 mg via ORAL
  Filled 2016-03-08 (×3): qty 10

## 2016-03-08 NOTE — Discharge Instructions (Signed)
Your cervix is dilated to .5 cm.  Your provider has recommended pelvic rest until further notice. This means no sex, orgasms and nothing in the vagina.  Take your prescription for Robutussin as directed for cough. This may help the cramping and contractions. Drink plenty of fluid, hydration also helps eliminate contractions.   Return to the hospital ASAP for more cramping or bleeding.   Follow up with [facility].   Return to the Birth Place at 8pm for your second dose of Steroids. Come straight to the nursing desk on Labor and Delivery.

## 2016-03-08 NOTE — Progress Notes (Signed)
2nd dose of BMZ given tonight at 20:30 (12mg ). Given via IM. Patient verbalized understanding and sent home afterwards in stable condition.

## 2016-03-08 NOTE — Discharge Summary (Signed)
Patient ID: Nancy Watson MRN: [id number] DOB/AGE: 34/04/1980 34 y.o.  Admit date: 03/07/2016 Admission Diagnoses: Preterm labor contractions at 27+5 weeks   Discharge date:  03/08/16 Discharge Diagnoses: Threatened Preterm Labor  Prenatal history: [id number]   EDC : 06/01/2016, Alternate EDD Entry  [facility]  Prenatal course complicated by Radiation exposure in pregnancy  Desires BTL  Hx of genital herpes: Needs Valtrex suppression at 36 wks  2% tile of PAPP test : increase risk for PIH and growth issue (needs Growth u/s third tri)  Subchorionic hemorrhage 12/07/15+ repeat 12/17/15 u/s 3.5x1 cm Anemia   Prenatal Labs: ABO, Rh: --/--/O POS (07/02 1601) Antibody: NEG (07/02 1601) Rubella:   Immune Varicella: Immune RPR:   NR HBsAg:   Negative HIV:   Negative  Medical / Surgical History :  Past medical history:  Past Medical History:  Diagnosis Date  . Migraines   . Syncope     Past surgical history:  Past Surgical History:  Procedure Laterality Date  . DILATION AND CURETTAGE OF UTERUS  09/2015  . LAPAROSCOPIC SALPINGO OOPHERECTOMY Right 10/03/2015   Procedure:  D&C,right ovarian cystectomy laparoscopic ,laparoscopic excision right ovarian mass;  Surgeon: Suzy Bouchardhomas J Schermerhorn, MD;  Location: [facility] ORS;  Service: Gynecology;  Laterality: Right;    Family History: History reviewed. No pertinent family history.  Social History:  reports that she has been smoking Cigarettes.  She has been smoking about 0.25 packs per day. She has never used smokeless tobacco. She reports that she does not drink alcohol or use drugs.  Allergies: Patient has no known allergies.   Current Medications at time of admission:  Prior to Admission medications   Medication Sig Start Date End Date Taking? Authorizing Provider  guaiFENesin (ROBITUSSIN) 100 MG/5ML SOLN Take 10 mLs (200 mg total) by mouth every 4 (four) hours as needed for cough or to loosen phlegm. 03/08/16    Karena AddisonMeredith C Sigmon, CNM  Prenatal Vit-Fe Fumarate-FA (PRENATAL MULTIVITAMIN) TABS tablet Take 1 tablet by mouth every morning.     Historical Provider, MD  progesterone (PROMETRIUM) 200 MG capsule Take 200 mg by mouth at bedtime.     Historical Provider, MD    Hospital Course:  Patient called the office on 03/07/16 with c/o vaginal bleeding on 03/07/16.  She had worsening cramping and contractions and came to triage.  Her internal os was found to be a fingertip, but ultrasound showed cervix appeared to be closed and measured 3.4cm.  Her FFN and all cultures were negative.  Her UDS was positive for marijuana and we discussed stopping marijuana use while pregnant. She received 2 doses of terbutaline with relief.  She received 1 dose of BMZ and will plan on coming back tonight for her second dose.  We gave her IV fluids and she had some relief from contractions and cervix was found to be unchanged.     Discharge Instructions:  Discharged Condition: good  Activity: pelvic rest  Diet: routine, increase hydration   Medications: PNV and Iron   Medication List    STOP taking these medications   progesterone 200 MG capsule Commonly known as:  PROMETRIUM     TAKE these medications   guaiFENesin 100 MG/5ML Soln Commonly known as:  ROBITUSSIN Take 10 mLs (200 mg total) by mouth every 4 (four) hours as needed for cough or to loosen phlegm.   prenatal multivitamin Tabs tablet Take 1 tablet by mouth every morning.       Discharge  Instructions: Preterm/ labor signs reviewed                                            Fetal kick counts                                             Discharge to: Home  Follow up :  Follow-up Information    Sharee Pimplearon W Jones, CNM Follow up in 1 day(s).   Specialty:  Obstetrics and Gynecology Why:  Come back to labor and delivery tonight at 8pm for second dose of Betamethasone steroid injection  Contact information: [address] [facility]  [place] [id number] [phone]          Dr. Dalbert GarnetBeasley aware and updated on plan of care  Signed:  Carlean JewsMeredith Sigmon, CNM

## 2016-03-08 NOTE — Progress Notes (Signed)
Nancy Watson came in tonight at 19:55 for her 2nd dose of BMZ. Patient denies bleeding, LOF. Reports positive fetal movement. Reports that "contractions are the same as yesterday, about every 4-6 mins apart. Will proceed with written orders from yesterday by M. Sigmon, CNM to repeat next dose of BMZ. Patient in stable condition and verbalizes understanding.

## 2016-03-08 NOTE — Progress Notes (Signed)
S:  Pt. Reports she is still feeling "crampy" contractions down low, but no changes or worsening s/s.  She states she got some relief with the Terbutaline, but feels like the ctxs are back.   O:  VS: Blood pressure 97/69, pulse 92, temperature 98.4 F (36.9 C), temperature source Oral, resp. rate 20, height 5\' 6"  (1.676 m), weight 68 kg (150 lb), last menstrual period 08/29/2015, SpO2 100 %.        FHR : baseline 150 bpm / variability moderate / accelerations + / occasional variable decelerations        Toco: irregular        Cervix : not assessed        Membranes: intact         No VB  A: Threatened PTL      Fetal monitoring reassuring  P: 2nd dose Terbutaline 0.25SQ     Continue to monitor overnight for worsening s/s     Re-evaluate cervix in AM  Carlean JewsMeredith Raeley Gilmore, CNM

## 2016-03-23 ENCOUNTER — Ambulatory Visit
Admission: RE | Admit: 2016-03-23 | Discharge: 2016-03-23 | Disposition: A | Discharge status: Home / Self Care | Payer: Medicaid Other | Source: Ambulatory Visit | Attending: Maternal and Fetal Medicine | Admitting: Maternal and Fetal Medicine

## 2016-03-23 ENCOUNTER — Other Ambulatory Visit: Payer: Self-pay | Admitting: *Deleted

## 2016-03-23 ENCOUNTER — Other Ambulatory Visit: Payer: Self-pay | Admitting: Maternal and Fetal Medicine

## 2016-03-23 DIAGNOSIS — O365931 Maternal care for other known or suspected poor fetal growth, third trimester, fetus 1: Secondary | ICD-10-CM

## 2016-03-23 DIAGNOSIS — Z369 Encounter for antenatal screening, unspecified: Secondary | ICD-10-CM

## 2016-03-23 DIAGNOSIS — Z3A30 30 weeks gestation of pregnancy: Secondary | ICD-10-CM | POA: Diagnosis not present

## 2016-03-23 DIAGNOSIS — O365990 Maternal care for other known or suspected poor fetal growth, unspecified trimester, not applicable or unspecified: Secondary | ICD-10-CM

## 2016-03-23 DIAGNOSIS — O365930 Maternal care for other known or suspected poor fetal growth, third trimester, not applicable or unspecified: Secondary | ICD-10-CM | POA: Diagnosis present

## 2016-03-24 ENCOUNTER — Observation Stay
Admission: EM | Admit: 2016-03-24 | Discharge: 2016-03-24 | Disposition: A | Discharge status: Home / Self Care | Payer: Medicaid Other | Attending: Obstetrics and Gynecology | Admitting: Obstetrics and Gynecology

## 2016-03-24 DIAGNOSIS — Z3A30 30 weeks gestation of pregnancy: Secondary | ICD-10-CM | POA: Diagnosis not present

## 2016-03-24 DIAGNOSIS — F17210 Nicotine dependence, cigarettes, uncomplicated: Secondary | ICD-10-CM | POA: Insufficient documentation

## 2016-03-24 DIAGNOSIS — O99333 Smoking (tobacco) complicating pregnancy, third trimester: Secondary | ICD-10-CM | POA: Insufficient documentation

## 2016-03-24 DIAGNOSIS — R109 Unspecified abdominal pain: Secondary | ICD-10-CM | POA: Diagnosis present

## 2016-03-24 LAB — URINALYSIS, ROUTINE W REFLEX MICROSCOPIC
Bilirubin Urine: NEGATIVE
GLUCOSE, UA: NEGATIVE mg/dL
Hgb urine dipstick: NEGATIVE
Ketones, ur: NEGATIVE mg/dL
LEUKOCYTES UA: NEGATIVE
NITRITE: NEGATIVE
PH: 9 — AB (ref 5.0–8.0)
Protein, ur: NEGATIVE mg/dL
Specific Gravity, Urine: 1.015 (ref 1.005–1.030)

## 2016-03-24 LAB — WET PREP, GENITAL
CLUE CELLS WET PREP: NONE SEEN
SPERM: NONE SEEN
TRICH WET PREP: NONE SEEN
Yeast Wet Prep HPF POC: NONE SEEN

## 2016-03-24 MED ORDER — ACETAMINOPHEN 325 MG PO TABS: 650.0000 mg | ORAL_TABLET | ORAL | Status: DC | PRN

## 2016-03-24 NOTE — Plan of Care (Signed)
Pt presents to l/d via EMS with c/o lower abdominal pressure that started last night.

## 2016-03-24 NOTE — OB Triage Provider Note (Signed)
Final triage note.  TRIAGE NOTE to rule out Preterm Labor   History of Present Illness: Nancy Watson is a 35 y.o. [id number] at 914w1d presenting to triage for preterm pain.  Pelvic pressure but no LOF, VB. Good fetal movement. No dysuria. No intercourse >2wks.  Patient Active Problem List   Diagnosis Date Noted  . Preterm labor 03/07/2016  . Abdominal pain affecting pregnancy 02/27/2016  . Pelvic pain 01/31/2016  . First trimester screening 11/18/2015  . Right ovarian cyst 10/03/2015  . Post-operative state 10/03/2015    Past Medical History:  Diagnosis Date  . Migraines   . Syncope     Past Surgical History:  Procedure Laterality Date  . DILATION AND CURETTAGE OF UTERUS  09/2015  . LAPAROSCOPIC SALPINGO OOPHERECTOMY Right 10/03/2015   Procedure:  D&C,right ovarian cystectomy laparoscopic ,laparoscopic excision right ovarian mass;  Surgeon: Suzy Bouchardhomas J Schermerhorn, MD;  Location: [facility] ORS;  Service: Gynecology;  Laterality: Right;    OB History  Gravida Para Term Preterm AB Living  6 3 3   2 3   SAB TAB Ectopic Multiple Live Births  1 0     3    # Outcome Date GA Lbr Len/2nd Weight Sex Delivery Anes PTL Lv  6 Current           5 Term 05/07/12    F Vag-Spont   LIV  4 SAB 2013          3 Term 12/21/05    F Vag-Spont   LIV  2 AB 2007          1 Term 10/20/03    M Vag-Spont   LIV      Social History   Social History  . Marital status: Single    Spouse name: N/A  . Number of children: N/A  . Years of education: N/A   Social History Main Topics  . Smoking status: Current Every Day Smoker    Packs/day: 0.25    Types: Cigarettes  . Smokeless tobacco: Never Used  . Alcohol use No  . Drug use: No  . Sexual activity: Yes   Other Topics Concern  . None   Social History Narrative  . None    History reviewed. No pertinent family history.  No Known Allergies  Prescriptions Prior to Admission  Medication Sig Dispense Refill Last Dose  . ferrous sulfate 325  (65 FE) MG tablet Take 325 mg by mouth daily with breakfast.   Taking  . guaiFENesin (ROBITUSSIN) 100 MG/5ML SOLN Take 10 mLs (200 mg total) by mouth every 4 (four) hours as needed for cough or to loosen phlegm. 1200 mL 0 Taking  . Prenatal Vit-Fe Fumarate-FA (PRENATAL MULTIVITAMIN) TABS tablet Take 1 tablet by mouth every morning.    Taking    Review of Systems - See HPI for OB specific ROS.   Vitals:  Temp 98.9 F (37.2 C)   LMP 08/29/2015 (Approximate)  Physical Examination: CONSTITUTIONAL: Well-developed, well-nourished female in no acute distress.  HENT:  Normocephalic, atraumatic EYES: Conjunctivae and EOM are normal. No scleral icterus.  NECK: Normal range of motion, supple, SKIN: Skin is warm and dry. No rash noted. Not diaphoretic. No erythema. No pallor. NEUROLGIC: Alert and oriented to person, place, and time. No gross cranial nerve deficit noted. PSYCHIATRIC: Normal mood and affect. Normal behavior. Normal judgment and thought content. CARDIOVASCULAR: Normal heart rate noted, regular rhythm RESPIRATORY: Effort and breath sounds normal, no problems with respiration noted ABDOMEN: Soft, nontender,  nondistended, gravid.  Cervix:  Membranes:intact Fetal Monitoring:Baseline: 145 bpm, Variability: Good {> 6 bpm), Accelerations: Non-reactive but appropriate for gestational age and Decelerations: Absent Tocometer: Flat  Labs:  No results found for this or any previous visit (from the past 24 hour(s)).  Imaging Studies: [place] Ob Limited  Result Date: 03/07/2016 CLINICAL DATA:  Contractions, second trimester. Gestational age by first ultrasound 27 weeks and 5 days. EXAM: LIMITED OBSTETRIC ULTRASOUND COMPARISON:  Obstetric ultrasound December 07, 2015 FINDINGS: Number of Fetuses: 1 Heart Rate:  145 bpm Movement: Present Presentation: Cephalic Placental Location: Anterior Previa: No Amniotic Fluid (Subjective):  Within normal limits. BPD:  7.1 cm 28w  3d FL:  5.1 cm 27w  2d MATERNAL  FINDINGS: Cervix:  Appears closed.  3.4 cm. Uterus/Adnexae:  No abnormality visualized. IMPRESSION: Single live intrauterine pregnancy, gestational age by ultrasound between 27 weeks 2 days and 28 weeks 3 days, concurrent dates. Cervix closed. This exam is performed on an emergent basis and does not comprehensively evaluate fetal size, dating, or anatomy; follow-up complete OB US should be considered if further fetal assessment is warranted. Electronically Signed   By: Awilda Metroourtnay  Bloomer M.D.   On: 03/07/2016 22:17   [place] Mfm Ob Detail +14 Wk  Result Date: 03/23/2016 OBSTETRICAL ULTRASOUND: This exam was performed within a [facility]. The OB US report was generated in the AS system, and faxed to the ordering physician.  This report is available in the [employer] PACS. See the AS Obstetric US report via the Image Link.    Assessment and Plan: Patient Active Problem List   Diagnosis Date Noted  . Preterm labor 03/07/2016  . Abdominal pain affecting pregnancy 02/27/2016  . Pelvic pain 01/31/2016  . First trimester screening 11/18/2015  . Right ovarian cyst 10/03/2015  . Post-operative state 10/03/2015    1. Concern for preterm labor:  - Labs FFN, wet prep, urinalysis. - Continue fetal monitoring - Continuous toco  Cervical exam reassuringly closed and high. Pain is improved and no evidence of infection. Wet prep bedside negative. UA pending. Likely d/c home today if no evidence of UTI with precautions.  Cline CoolsBethany E Maelee Hoot, MD, MPH

## 2016-03-24 NOTE — Plan of Care (Signed)
Pt discharged home with discharge instructions.

## 2016-03-24 NOTE — Plan of Care (Signed)
Pt seen by dr Dalbert Garnetbeasley. Cervix fingertip. Wet prep and urinalysis done. ffn done but not sent per dr Dalbert Garnetbeasley. EFM turned off per dr beasley's order

## 2016-03-24 NOTE — Plan of Care (Signed)
Spoke to dr Dalbert Garnetbeasley  Regarding pt's presence on unit and pt's complaint.

## 2016-04-01 NOTE — Discharge Summary (Signed)
TRIAGE NOTE to rule out Preterm Labor   History of Present Illness: Nancy Watson is a 35 y.o. [id number] at 1558w1d presenting to triage for preterm pain.  Pelvic pressure but no LOF, VB. Good fetal movement. No dysuria. No intercourse >2wks.      Patient Active Problem List   Diagnosis Date Noted  . Preterm labor 03/07/2016  . Abdominal pain affecting pregnancy 02/27/2016  . Pelvic pain 01/31/2016  . First trimester screening 11/18/2015  . Right ovarian cyst 10/03/2015  . Post-operative state 10/03/2015        Past Medical History:  Diagnosis Date  . Migraines   . Syncope          Past Surgical History:  Procedure Laterality Date  . DILATION AND CURETTAGE OF UTERUS  09/2015  . LAPAROSCOPIC SALPINGO OOPHERECTOMY Right 10/03/2015   Procedure:  D&C,right ovarian cystectomy laparoscopic ,laparoscopic excision right ovarian mass;  Surgeon: Suzy Bouchardhomas J Schermerhorn, MD;  Location: [facility] ORS;  Service: Gynecology;  Laterality: Right;                    OB History  Gravida Para Term Preterm AB Living  6 3 3   2 3   SAB TAB Ectopic Multiple Live Births  1 0     3    # Outcome Date GA Lbr Len/2nd Weight Sex Delivery Anes PTL Lv  6 Current           5 Term 05/07/12    F Vag-Spont   LIV  4 SAB 2013          3 Term 12/21/05    F Vag-Spont   LIV  2 AB 2007          1 Term 10/20/03    M Vag-Spont   LIV      Social History        Social History  . Marital status: Single    Spouse name: N/A  . Number of children: N/A  . Years of education: N/A        Social History Main Topics  . Smoking status: Current Every Day Smoker    Packs/day: 0.25    Types: Cigarettes  . Smokeless tobacco: Never Used  . Alcohol use No  . Drug use: No  . Sexual activity: Yes       Other Topics Concern  . None      Social History Narrative  . None    History reviewed. No pertinent family history.  No Known  Allergies         Prescriptions Prior to Admission  Medication Sig Dispense Refill Last Dose  . ferrous sulfate 325 (65 FE) MG tablet Take 325 mg by mouth daily with breakfast.   Taking  . guaiFENesin (ROBITUSSIN) 100 MG/5ML SOLN Take 10 mLs (200 mg total) by mouth every 4 (four) hours as needed for cough or to loosen phlegm. 1200 mL 0 Taking  . Prenatal Vit-Fe Fumarate-FA (PRENATAL MULTIVITAMIN) TABS tablet Take 1 tablet by mouth every morning.    Taking    Review of Systems - See HPI for OB specific ROS.   Vitals:  Temp 98.9 F (37.2 C)   LMP 08/29/2015 (Approximate)  Physical Examination: CONSTITUTIONAL: Well-developed, well-nourished female in no acute distress.  HENT:  Normocephalic, atraumatic EYES: Conjunctivae and EOM are normal. No scleral icterus.  NECK: Normal range of motion, supple, SKIN: Skin is warm and dry. No rash noted. Not diaphoretic. No erythema. No  pallor. NEUROLGIC: Alert and oriented to person, place, and time. No gross cranial nerve deficit noted. PSYCHIATRIC: Normal mood and affect. Normal behavior. Normal judgment and thought content. CARDIOVASCULAR: Normal heart rate noted, regular rhythm RESPIRATORY: Effort and breath sounds normal, no problems with respiration noted ABDOMEN: Soft, nontender, nondistended, gravid.  Cervix:  Membranes:intact Fetal Monitoring:Baseline: 145 bpm, Variability: Good {> 6 bpm), Accelerations: Non-reactive but appropriate for gestational age and Decelerations: Absent Tocometer: Flat  Labs:  No results found for this or any previous visit (from the past 24 hour(s)).  Imaging Studies:  ImagingResults  [place] Ob Limited  Result Date: 03/07/2016 CLINICAL DATA:  Contractions, second trimester. Gestational age by first ultrasound 27 weeks and 5 days. EXAM: LIMITED OBSTETRIC ULTRASOUND COMPARISON:  Obstetric ultrasound December 07, 2015 FINDINGS: Number of Fetuses: 1 Heart Rate:  145 bpm Movement: Present Presentation:  Cephalic Placental Location: Anterior Previa: No Amniotic Fluid (Subjective):  Within normal limits. BPD:  7.1 cm 28w  3d FL:  5.1 cm 27w  2d MATERNAL FINDINGS: Cervix:  Appears closed.  3.4 cm. Uterus/Adnexae:  No abnormality visualized. IMPRESSION: Single live intrauterine pregnancy, gestational age by ultrasound between 27 weeks 2 days and 28 weeks 3 days, concurrent dates. Cervix closed. This exam is performed on an emergent basis and does not comprehensively evaluate fetal size, dating, or anatomy; follow-up complete OB US should be considered if further fetal assessment is warranted. Electronically Signed   By: Awilda Metroourtnay  Bloomer M.D.   On: 03/07/2016 22:17   [place] Mfm Ob Detail +14 Wk  Result Date: 03/23/2016 OBSTETRICAL ULTRASOUND: This exam was performed within a [facility]. The OB US report was generated in the AS system, and faxed to the ordering physician.  This report is available in the [employer] PACS. See the AS Obstetric US report via the Image Link.     Assessment and Plan:     Patient Active Problem List   Diagnosis Date Noted  . Preterm labor 03/07/2016  . Abdominal pain affecting pregnancy 02/27/2016  . Pelvic pain 01/31/2016  . First trimester screening 11/18/2015  . Right ovarian cyst 10/03/2015  . Post-operative state 10/03/2015    1. Concern for preterm labor:  - Labs FFN, wet prep, urinalysis. - Continue fetal monitoring - Continuous toco  Cervical exam reassuringly closed and high. Pain is improved and no evidence of infection. Wet prep bedside negative. UA negative. Home with precautions.

## 2016-04-03 NOTE — L&D Delivery Note (Signed)
Delivery Note At 8:54 PM a viable female was delivered via Vaginal, Spontaneous Delivery (Presentation: ;  ).  APGAR: 8, 9; weight 5 lb 1.5 oz (2310 g).   Placenta status: , .  Cord:  with the following complications: .  Cord pH: none  Anesthesia:   Episiotomy:   Lacerations:   Suture Repairsee note By Sigmon Est. Blood Loss (mL):    Mom to postpartum.  Baby to Couplet care / Skin to Skin.  SCHERMERHORN,THOMAS 05/12/2016, 9:55 PM

## 2016-04-03 NOTE — L&D Delivery Note (Addendum)
Delivery Note  First Stage: Induction of labor for symmetrical IUGR on 05/11/16 Labor onset: 05/12/16 at 1808 Augmentation : Cervidil, AROM, Pitocin  Analgesia /Anesthesia intrapartum: Stadol IVP and Epidural  AROM at 1808  Second Stage: Complete dilation at 2044 Onset of pushing at 2045 FHR second stage: baseline: 145 bpm/ moderate variability/ +accels and scalp stimulation, variable decelerations to a nadir of 100 bpm.   Delivery of a viable female "Kendrick" at 2054 by Carlean JewsMeredith Anjolaoluwa Siguenza, CNM in ROA position. Loose nuchal cord x1 - reduced over head.  The anterior shoulder and body quickly followed.  He was brought mom's abdomen and was dried and vigorously crying. Cord double clamped after cessation of pulsation, cut by Carlean JewsMeredith Brayla Pat, CNM  Cord blood sample collected   Third Stage: Placenta was starting to detach at 30 minutes, but I felt like it was abnormally adherent to the uterine wall. There was a large blood clot forming inside the placenta and came down to the introitus.  I was able to track the umbilical cord but could not find clear margins. At 30 minutes, I notified Dr. Feliberto GottronSchermerhorn and he was in route.  Prior to his arrival, bleeding became brisk and I called in Dr. Valentino Saxonherry at 2133, who was on the floor, for additional assessment.  Dr. Valentino Saxonherry manually extracted the placenta and teased out membranes at 2137, and Dr. Feliberto GottronSchermerhorn arrived immediately after. He also performed an assessment.  I used the speculum to visual cervix and assess for trailing membranes and retained tissue. Uterine tone was firm and Pitocin 500mL bolus infusing. Bleeding slowed.  No other uterotonic meds required.  Placenta disposition: surgical pathology   No lacerations identified  Est. Blood Loss (mL): 1130 mL by quantitative blood loss  Complications: Abnormally adherent placenta requiring manual extraction resulting in postpartum hemorrhage  Mom to postpartum.  Baby to Couplet care / Skin to  Skin.  Newborn: Birth Weight: 2310 grams (5#1oz) Apgar Scores: 8, 9 Feeding planned: Formula   Carlean JewsMeredith Alira Fretwell, CNM

## 2016-04-10 ENCOUNTER — Other Ambulatory Visit: Payer: Self-pay | Admitting: *Deleted

## 2016-04-10 DIAGNOSIS — O365930 Maternal care for other known or suspected poor fetal growth, third trimester, not applicable or unspecified: Secondary | ICD-10-CM

## 2016-04-13 ENCOUNTER — Other Ambulatory Visit: Payer: Self-pay | Admitting: Maternal and Fetal Medicine

## 2016-04-13 ENCOUNTER — Ambulatory Visit
Admission: RE | Admit: 2016-04-13 | Discharge: 2016-04-13 | Disposition: A | Discharge status: Home / Self Care | Payer: Medicaid Other | Source: Ambulatory Visit | Attending: Maternal and Fetal Medicine | Admitting: Maternal and Fetal Medicine

## 2016-04-13 DIAGNOSIS — Z3A33 33 weeks gestation of pregnancy: Secondary | ICD-10-CM | POA: Diagnosis not present

## 2016-04-13 DIAGNOSIS — O365930 Maternal care for other known or suspected poor fetal growth, third trimester, not applicable or unspecified: Secondary | ICD-10-CM | POA: Diagnosis present

## 2016-04-13 DIAGNOSIS — Z369 Encounter for antenatal screening, unspecified: Secondary | ICD-10-CM

## 2016-04-20 ENCOUNTER — Other Ambulatory Visit: Payer: Medicaid Other

## 2016-04-24 ENCOUNTER — Other Ambulatory Visit: Payer: Self-pay | Admitting: *Deleted

## 2016-04-24 ENCOUNTER — Ambulatory Visit
Admission: RE | Admit: 2016-04-24 | Discharge: 2016-04-24 | Disposition: A | Discharge status: Home / Self Care | Payer: Medicaid Other | Source: Ambulatory Visit | Attending: Obstetrics and Gynecology | Admitting: Obstetrics and Gynecology

## 2016-04-24 DIAGNOSIS — Z3A34 34 weeks gestation of pregnancy: Secondary | ICD-10-CM

## 2016-04-24 DIAGNOSIS — O365931 Maternal care for other known or suspected poor fetal growth, third trimester, fetus 1: Secondary | ICD-10-CM

## 2016-04-24 DIAGNOSIS — Z369 Encounter for antenatal screening, unspecified: Secondary | ICD-10-CM

## 2016-04-24 DIAGNOSIS — O365930 Maternal care for other known or suspected poor fetal growth, third trimester, not applicable or unspecified: Secondary | ICD-10-CM | POA: Diagnosis not present

## 2016-04-24 NOTE — Progress Notes (Signed)
NST at 3371w4d for FGR: Baseline FHR 140's Mod variability 15x15 accels; present No decels No significant uterine activity Reactive NST

## 2016-04-27 ENCOUNTER — Encounter: Payer: Self-pay | Admitting: *Deleted

## 2016-04-27 ENCOUNTER — Other Ambulatory Visit: Payer: Self-pay | Admitting: *Deleted

## 2016-04-27 ENCOUNTER — Observation Stay
Admission: AD | Admit: 2016-04-27 | Discharge: 2016-04-27 | Disposition: A | Discharge status: Home / Self Care | Payer: Medicaid Other | Source: Ambulatory Visit | Attending: Obstetrics and Gynecology | Admitting: Obstetrics and Gynecology

## 2016-04-27 ENCOUNTER — Ambulatory Visit
Admission: RE | Admit: 2016-04-27 | Discharge: 2016-04-27 | Disposition: A | Discharge status: Home / Self Care | Payer: Medicaid Other | Source: Ambulatory Visit | Attending: Obstetrics and Gynecology | Admitting: Obstetrics and Gynecology

## 2016-04-27 ENCOUNTER — Ambulatory Visit (HOSPITAL_BASED_OUTPATIENT_CLINIC_OR_DEPARTMENT_OTHER)
Admission: RE | Admit: 2016-04-27 | Discharge: 2016-04-27 | Disposition: A | Discharge status: Home / Self Care | Payer: Medicaid Other | Source: Ambulatory Visit | Attending: Obstetrics and Gynecology | Admitting: Obstetrics and Gynecology

## 2016-04-27 VITALS — BP 115/65 | HR 86 | Temp 98.1°F | Wt 153.0 lb

## 2016-04-27 DIAGNOSIS — O365991 Maternal care for other known or suspected poor fetal growth, unspecified trimester, fetus 1: Secondary | ICD-10-CM

## 2016-04-27 DIAGNOSIS — O26893 Other specified pregnancy related conditions, third trimester: Secondary | ICD-10-CM | POA: Diagnosis present

## 2016-04-27 DIAGNOSIS — O365930 Maternal care for other known or suspected poor fetal growth, third trimester, not applicable or unspecified: Secondary | ICD-10-CM | POA: Diagnosis not present

## 2016-04-27 DIAGNOSIS — F17210 Nicotine dependence, cigarettes, uncomplicated: Secondary | ICD-10-CM | POA: Diagnosis not present

## 2016-04-27 DIAGNOSIS — Z3A35 35 weeks gestation of pregnancy: Secondary | ICD-10-CM

## 2016-04-27 DIAGNOSIS — Z369 Encounter for antenatal screening, unspecified: Secondary | ICD-10-CM

## 2016-04-27 DIAGNOSIS — O99333 Smoking (tobacco) complicating pregnancy, third trimester: Secondary | ICD-10-CM | POA: Insufficient documentation

## 2016-04-27 DIAGNOSIS — R102 Pelvic and perineal pain: Secondary | ICD-10-CM | POA: Insufficient documentation

## 2016-04-27 DIAGNOSIS — O365931 Maternal care for other known or suspected poor fetal growth, third trimester, fetus 1: Secondary | ICD-10-CM

## 2016-04-27 LAB — FETAL FIBRONECTIN: Fetal Fibronectin: NEGATIVE

## 2016-04-27 NOTE — H&P (Signed)
Nancy Watson is a 36 y.o. female presenting for "UC's today and pelvic pressure". NO LOF, NO VB or decreased FM.  OB History    Gravida Para Term Preterm AB Living   6 3 3   2 3    SAB TAB Ectopic Multiple Live Births   1 0     3     Past Medical History:  Diagnosis Date  . Migraines   . Syncope    Past Surgical History:  Procedure Laterality Date  . DILATION AND CURETTAGE OF UTERUS  09/2015  . LAPAROSCOPIC SALPINGO OOPHERECTOMY Right 10/03/2015   Procedure:  D&C,right ovarian cystectomy laparoscopic ,laparoscopic excision right ovarian mass;  Surgeon: Suzy Bouchardhomas J Schermerhorn, MD;  Location: [facility] ORS;  Service: Gynecology;  Laterality: Right;   Family History: family history is not on file. Social History:  reports that she has been smoking Cigarettes.  She has been smoking about 0.25 packs per day. She has never used smokeless tobacco. She reports that she does not drink alcohol or use drugs.     Maternal Diabetes:  Genetic Screening:  Maternal Ultrasounds/Referrals:  Fetal Ultrasounds or other Referrals:   Maternal Substance Abuse: Significant Maternal Medications:   Significant Maternal Lab Results:   Other Comments:    Review of Systems  Constitutional: Negative.   HENT: Negative.   Eyes: Negative.   Respiratory: Negative.   Cardiovascular: Negative.   Gastrointestinal: Negative.   Genitourinary: Negative.   Musculoskeletal: Negative.   Skin: Negative.   Neurological: Negative.   Endo/Heme/Allergies: Negative.   Psychiatric/Behavioral: Negative.    History Dilation: 1 Effacement (%): 30 Station: -3 Exam by:: Millner RN Blood pressure (!) 111/59, pulse 79, temperature 98.3 F (36.8 C), temperature source Oral, resp. rate 18, last menstrual period 08/29/2015. Exam Physical Exam  Prenatal labs: ABO, Rh: --/--/O POS (07/02 1601) Antibody: NEG (07/02 1601) Rubella:   RPR:    HBsAg:    HIV:    GBS:     Assessment/Plan: A: IUP at 35 weeks with Th PTL P:  1. NST reactive with 2 accels 15 x 15 BPM noted, no decels, occas UC   Sharee Pimplearon W Halleigh Comes 04/27/2016, 6:03 PM

## 2016-04-27 NOTE — Progress Notes (Signed)
Nancy Watson is a 36 y.o. [id number]  With LMO of 07/26/15  [facility] 05/01/16 from US  at 634w0d [facility]. Pt has hx of IUGR and attends [facility] appt for NST and [facility] for growth US. Past Medical History:  Diagnosis Date  . Migraines   . Syncope    Past Surgical History:  Procedure Laterality Date  . DILATION AND CURETTAGE OF UTERUS  09/2015  . LAPAROSCOPIC SALPINGO OOPHERECTOMY Right 10/03/2015   Procedure:  D&C,right ovarian cystectomy laparoscopic ,laparoscopic excision right ovarian mass;  Surgeon: Suzy Bouchardhomas J Schermerhorn, MD;  Location: [facility] ORS;  Service: Gynecology;  Laterality: Right;  History reviewed. No pertinent family history.  Social History   Social History  . Marital status: Single    Spouse name: N/A  . Number of children: N/A  . Years of education: N/A   Occupational History  . Not on file.   Social History Main Topics  . Smoking status: Current Every Day Smoker    Packs/day: 0.25    Types: Cigarettes  . Smokeless tobacco: Never Used  . Alcohol use No  . Drug use: No  . Sexual activity: Yes   Other Topics Concern  . Not on file   Social History Narrative  . No narrative on file   OB History  Gravida Para Term Preterm AB Living  6 3 3   2 3   SAB TAB Ectopic Multiple Live Births  1 0     3    # Outcome Date GA Lbr Len/2nd Weight Sex Delivery Anes PTL Lv  6 Current           5 Term 05/07/12    F Vag-Spont   LIV  4 SAB 2013          3 Term 12/21/05    F Vag-Spont   LIV  2 AB 2007          1 Term 10/20/03    M Vag-Spont   LIV    Subjective: Pain is better now just pressure  Objective: BP (!) 111/59 (BP Location: Right Arm)   Pulse 79   Temp 98.3 F (36.8 C) (Oral)   Resp 18   LMP 08/29/2015 (Approximate)   FHT:  140, reactive NST, +accels, no decels, cat 1 UC:  None on monitor SVE:   Dilation: 1 Effacement (%): 30 Station: -3 Exam by:: [employer] CorporationMillner RN  Labs: Lab  Results  Component Value Date   WBC 8.4 03/07/2016   HGB 10.1 (L) 03/07/2016   HCT 29.0 (L) 03/07/2016   MCV 84.8 03/07/2016   PLT 357 03/07/2016   US: 04/13/16 EFW 1674 gms 5%, Dopplers S/D 2.74 at 56%. AC is at the 3rd%.   Assessment / Plan: A: IUP at 35 weeks 2. IUGR 3. UC's ruled out P: [facility]. IOL as planned at 05/11/16. 2. Monitor strict [facility]KC's. 3. FU here for any concerns. Labor: ruled out Preeclampsia:  none Fetal Wellbeing: Cat 1 strip    Sharee Pimplearon W Jones 04/27/2016, 7:54 PM

## 2016-04-27 NOTE — Progress Notes (Signed)
Pt. Currently eating regular. Pain 6/10, pelvic pressure.

## 2016-04-27 NOTE — Progress Notes (Signed)
NST at 35 w  for FGR: Baseline FHR 140's Mod variability 15x15 accels; present No decels irregular uterine activity- pt c/o painful UCs  Reactive NST AFI =7.7 ceph doppler s/d ratio 2.95 - wnl Jimmey RalphLivingston, Brieanna Nau

## 2016-04-27 NOTE — OB Triage Note (Signed)
Pt. [facility], contractions.  Pain 9/10 - pelvic pressure. Pt. Denies sudden gush of fluid, no vaginal bleeding, positive for fetal movement.  Last sexual encounter was "last week".

## 2016-04-28 NOTE — Discharge Summary (Signed)
Obstetric Discharge Summary Reason for Admission: UC's noted on [facility] monitor and sent over Prenatal Procedures: Ultrasound for IUGR Intrapartum Procedures: None  Postpartum Procedures:None yet Complications-Operative and Postpartum: none Hemoglobin  Date Value Ref Range Status  03/07/2016 10.1 (L) 12.0 - 16.0 g/dL Final   HGB  Date Value Ref Range Status  01/20/2014 12.0 12.0 - 16.0 g/dL Final   HCT  Date Value Ref Range Status  03/07/2016 29.0 (L) 35.0 - 47.0 % Final  01/20/2014 36.8 35.0 - 47.0 % Final    Physical Exam:  General: A,A&O x3 UC: no contractions on monitor and pt says, she occas feels pressure only Uterine Fundus:Gravid Incision: None DVT Evaluation:neg  Discharge Diagnoses: IUP at 35 weeks with IUGR Pelvic pressure  Discharge Information: Date: 04/28/2016 Activity: pelvic rest Diet:Reg Medications: PNV Condition: stable Instructions: fu at next appt for NST and weekly AFI/BPP Discharge to: home  Plan IOL when [facility] schedules Newborn Data: This patient has no babies on file.  Sharee PimpleCaron W Haeleigh Streiff 04/28/2016, 8:00 AM

## 2016-05-01 ENCOUNTER — Other Ambulatory Visit: Payer: Self-pay | Admitting: *Deleted

## 2016-05-01 DIAGNOSIS — O365931 Maternal care for other known or suspected poor fetal growth, third trimester, fetus 1: Secondary | ICD-10-CM

## 2016-05-02 LAB — OB RESULTS CONSOLE GBS: STREP GROUP B AG: NEGATIVE

## 2016-05-08 ENCOUNTER — Other Ambulatory Visit: Payer: Self-pay | Admitting: *Deleted

## 2016-05-08 ENCOUNTER — Other Ambulatory Visit: Payer: Medicaid Other

## 2016-05-08 ENCOUNTER — Inpatient Hospital Stay: Admission: RE | Admit: 2016-05-08 | Payer: Medicaid Other | Source: Ambulatory Visit

## 2016-05-08 DIAGNOSIS — O365931 Maternal care for other known or suspected poor fetal growth, third trimester, fetus 1: Secondary | ICD-10-CM

## 2016-05-11 ENCOUNTER — Inpatient Hospital Stay
Admission: EM | Admit: 2016-05-11 | Discharge: 2016-05-14 | DRG: 767 | Disposition: A | Discharge status: Home / Self Care | Payer: Medicaid Other | Attending: Obstetrics and Gynecology | Admitting: Obstetrics and Gynecology

## 2016-05-11 ENCOUNTER — Other Ambulatory Visit: Payer: Medicaid Other

## 2016-05-11 ENCOUNTER — Ambulatory Visit: Payer: Medicaid Other

## 2016-05-11 DIAGNOSIS — O99334 Smoking (tobacco) complicating childbirth: Secondary | ICD-10-CM | POA: Diagnosis present

## 2016-05-11 DIAGNOSIS — O365930 Maternal care for other known or suspected poor fetal growth, third trimester, not applicable or unspecified: Secondary | ICD-10-CM | POA: Diagnosis present

## 2016-05-11 DIAGNOSIS — O9081 Anemia of the puerperium: Secondary | ICD-10-CM | POA: Diagnosis not present

## 2016-05-11 DIAGNOSIS — D62 Acute posthemorrhagic anemia: Secondary | ICD-10-CM | POA: Diagnosis not present

## 2016-05-11 DIAGNOSIS — Z302 Encounter for sterilization: Secondary | ICD-10-CM

## 2016-05-11 DIAGNOSIS — Z3A37 37 weeks gestation of pregnancy: Secondary | ICD-10-CM | POA: Diagnosis not present

## 2016-05-11 DIAGNOSIS — F17210 Nicotine dependence, cigarettes, uncomplicated: Secondary | ICD-10-CM | POA: Diagnosis present

## 2016-05-11 DIAGNOSIS — Z9889 Other specified postprocedural states: Secondary | ICD-10-CM

## 2016-05-11 LAB — CBC
HEMATOCRIT: 31.2 % — AB (ref 35.0–47.0)
Hemoglobin: 10.8 g/dL — ABNORMAL LOW (ref 12.0–16.0)
MCH: 29.7 pg (ref 26.0–34.0)
MCHC: 34.5 g/dL (ref 32.0–36.0)
MCV: 86 fL (ref 80.0–100.0)
PLATELETS: 310 10*3/uL (ref 150–440)
RBC: 3.63 MIL/uL — AB (ref 3.80–5.20)
RDW: 14.6 % — ABNORMAL HIGH (ref 11.5–14.5)
WBC: 7.4 10*3/uL (ref 3.6–11.0)

## 2016-05-11 MED ORDER — DINOPROSTONE 10 MG VA INST
10.0000 mg | VAGINAL_INSERT | Freq: Once | VAGINAL | Status: AC
  Administered 2016-05-11: 10 mg via VAGINAL
  Filled 2016-05-11: qty 1

## 2016-05-11 MED ORDER — OXYTOCIN 10 UNIT/ML IJ SOLN
INTRAMUSCULAR | Status: AC
  Filled 2016-05-11: qty 2

## 2016-05-11 MED ORDER — LIDOCAINE HCL (PF) 1 % IJ SOLN
INTRAMUSCULAR | Status: AC
  Filled 2016-05-11: qty 30

## 2016-05-11 MED ORDER — MISOPROSTOL 200 MCG PO TABS
ORAL_TABLET | ORAL | Status: AC
  Administered 2016-05-12: 25 ug via BUCCAL
  Filled 2016-05-11: qty 4

## 2016-05-11 MED ORDER — BUTORPHANOL TARTRATE 1 MG/ML IJ SOLN
1.0000 mg | INTRAMUSCULAR | Status: DC | PRN
  Administered 2016-05-12: 1 mg via INTRAVENOUS
  Filled 2016-05-11: qty 1

## 2016-05-11 MED ORDER — ONDANSETRON HCL 4 MG/2ML IJ SOLN
4.0000 mg | Freq: Four times a day (QID) | INTRAMUSCULAR | Status: DC | PRN
  Administered 2016-05-12: 4 mg via INTRAVENOUS
  Filled 2016-05-11: qty 2

## 2016-05-11 MED ORDER — LIDOCAINE HCL (PF) 1 % IJ SOLN
30.0000 mL | INTRAMUSCULAR | Status: AC | PRN
  Administered 2016-05-12: 3 mL via SUBCUTANEOUS

## 2016-05-11 MED ORDER — ACETAMINOPHEN 500 MG PO TABS: 1000.0000 mg | ORAL_TABLET | Freq: Four times a day (QID) | ORAL | Status: DC | PRN

## 2016-05-11 MED ORDER — OXYTOCIN 40 UNITS IN LACTATED RINGERS INFUSION - SIMPLE MED
2.5000 [IU]/h | INTRAVENOUS | Status: DC
Start: 2016-05-11 — End: 2016-05-12

## 2016-05-11 MED ORDER — AMMONIA AROMATIC IN INHA
RESPIRATORY_TRACT | Status: AC
  Filled 2016-05-11: qty 10

## 2016-05-11 MED ORDER — LACTATED RINGERS IV SOLN
INTRAVENOUS | Status: DC
  Administered 2016-05-11 – 2016-05-12 (×4): via INTRAVENOUS

## 2016-05-11 MED ORDER — ZOLPIDEM TARTRATE 5 MG PO TABS
ORAL_TABLET | ORAL | Status: AC
  Filled 2016-05-11: qty 1

## 2016-05-11 MED ORDER — OXYTOCIN 40 UNITS IN LACTATED RINGERS INFUSION - SIMPLE MED
INTRAVENOUS | Status: AC
  Administered 2016-05-12: 2 m[IU]/min via INTRAVENOUS
  Filled 2016-05-11: qty 1000

## 2016-05-11 MED ORDER — ZOLPIDEM TARTRATE 5 MG PO TABS
5.0000 mg | ORAL_TABLET | Freq: Every evening | ORAL | Status: DC | PRN
  Administered 2016-05-11: 5 mg via ORAL

## 2016-05-11 MED ORDER — LACTATED RINGERS IV SOLN: 500.0000 mL | INTRAVENOUS | Status: DC | PRN

## 2016-05-11 MED ORDER — OXYTOCIN 40 UNITS IN LACTATED RINGERS INFUSION - SIMPLE MED
1.0000 m[IU]/min | INTRAVENOUS | Status: DC
Start: 2016-05-11 — End: 2016-05-12
  Filled 2016-05-11: qty 1000

## 2016-05-11 MED ORDER — TERBUTALINE SULFATE 1 MG/ML IJ SOLN: 0.2500 mg | Freq: Once | INTRAMUSCULAR | Status: DC | PRN

## 2016-05-11 MED ORDER — OXYTOCIN BOLUS FROM INFUSION: 500.0000 mL | Freq: Once | INTRAVENOUS | Status: DC

## 2016-05-11 MED ORDER — SOD CITRATE-CITRIC ACID 500-334 MG/5ML PO SOLN: 30.0000 mL | ORAL | Status: DC | PRN

## 2016-05-12 ENCOUNTER — Inpatient Hospital Stay: Payer: Medicaid Other | Admitting: Anesthesiology

## 2016-05-12 LAB — TYPE AND SCREEN
ABO/RH(D): O POS
ANTIBODY SCREEN: NEGATIVE

## 2016-05-12 MED ORDER — OXYCODONE-ACETAMINOPHEN 5-325 MG PO TABS
1.0000 | ORAL_TABLET | ORAL | Status: DC | PRN
  Filled 2016-05-12 (×2): qty 1

## 2016-05-12 MED ORDER — SENNOSIDES-DOCUSATE SODIUM 8.6-50 MG PO TABS
2.0000 | ORAL_TABLET | ORAL | Status: DC
  Administered 2016-05-13: 2 via ORAL
  Filled 2016-05-12: qty 2

## 2016-05-12 MED ORDER — OXYCODONE-ACETAMINOPHEN 5-325 MG PO TABS
2.0000 | ORAL_TABLET | ORAL | Status: DC | PRN
  Administered 2016-05-13 – 2016-05-14 (×3): 2 via ORAL
  Filled 2016-05-12 (×3): qty 2

## 2016-05-12 MED ORDER — LIDOCAINE-EPINEPHRINE (PF) 1.5 %-1:200000 IJ SOLN
INTRAMUSCULAR | Status: DC | PRN
  Administered 2016-05-12: 3 mL via PERINEURAL

## 2016-05-12 MED ORDER — WITCH HAZEL-GLYCERIN EX PADS: 1.0000 "application " | MEDICATED_PAD | CUTANEOUS | Status: DC | PRN

## 2016-05-12 MED ORDER — LACTATED RINGERS IV SOLN
500.0000 mL | INTRAVENOUS | Status: DC | PRN
Start: 2016-05-12 — End: 2016-05-12

## 2016-05-12 MED ORDER — DIBUCAINE 1 % RE OINT: 1.0000 "application " | TOPICAL_OINTMENT | RECTAL | Status: DC | PRN

## 2016-05-12 MED ORDER — ONDANSETRON HCL 4 MG PO TABS
4.0000 mg | ORAL_TABLET | ORAL | Status: DC | PRN
Start: 2016-05-12 — End: 2016-05-14

## 2016-05-12 MED ORDER — BUPIVACAINE HCL (PF) 0.25 % IJ SOLN
INTRAMUSCULAR | Status: DC | PRN
  Administered 2016-05-12 (×2): 5 mL via EPIDURAL

## 2016-05-12 MED ORDER — OXYCODONE-ACETAMINOPHEN 5-325 MG PO TABS: 1.0000 | ORAL_TABLET | ORAL | Status: DC | PRN

## 2016-05-12 MED ORDER — COCONUT OIL OIL: 1.0000 "application " | TOPICAL_OIL | Status: DC | PRN

## 2016-05-12 MED ORDER — FERROUS SULFATE 325 (65 FE) MG PO TABS
325.0000 mg | ORAL_TABLET | Freq: Two times a day (BID) | ORAL | Status: DC
  Administered 2016-05-13 – 2016-05-14 (×2): 325 mg via ORAL
  Filled 2016-05-12 (×3): qty 1

## 2016-05-12 MED ORDER — ACETAMINOPHEN 325 MG PO TABS
650.0000 mg | ORAL_TABLET | ORAL | Status: DC | PRN
Start: 2016-05-12 — End: 2016-05-12

## 2016-05-12 MED ORDER — FENTANYL 2.5 MCG/ML W/ROPIVACAINE 0.2% IN NS 100 ML EPIDURAL INFUSION (ARMC-ANES): 10.0000 mL/h | EPIDURAL | Status: DC

## 2016-05-12 MED ORDER — ACETAMINOPHEN 325 MG PO TABS: 650.0000 mg | ORAL_TABLET | ORAL | Status: DC | PRN

## 2016-05-12 MED ORDER — EPHEDRINE 5 MG/ML INJ
10.0000 mg | INTRAVENOUS | Status: DC | PRN
  Filled 2016-05-12: qty 2

## 2016-05-12 MED ORDER — MISOPROSTOL 25 MCG QUARTER TABLET
25.0000 ug | ORAL_TABLET | ORAL | Status: DC
  Administered 2016-05-12 – 2016-05-13 (×7): 25 ug via BUCCAL
  Filled 2016-05-12 (×2): qty 1
  Filled 2016-05-12: qty 0.25
  Filled 2016-05-12 (×3): qty 1

## 2016-05-12 MED ORDER — ZOLPIDEM TARTRATE 5 MG PO TABS: 5.0000 mg | ORAL_TABLET | Freq: Every evening | ORAL | Status: DC | PRN

## 2016-05-12 MED ORDER — PHENYLEPHRINE 40 MCG/ML (10ML) SYRINGE FOR IV PUSH (FOR BLOOD PRESSURE SUPPORT)
80.0000 ug | PREFILLED_SYRINGE | INTRAVENOUS | Status: DC | PRN
  Filled 2016-05-12: qty 5

## 2016-05-12 MED ORDER — ROPIVACAINE HCL 2 MG/ML IJ SOLN
10.0000 mL/h | INTRAMUSCULAR | Status: AC
  Filled 2016-05-12 (×3): qty 5

## 2016-05-12 MED ORDER — PRENATAL MULTIVITAMIN CH: 1.0000 | ORAL_TABLET | Freq: Every day | ORAL | Status: DC

## 2016-05-12 MED ORDER — FENTANYL 2.5 MCG/ML W/ROPIVACAINE 0.2% IN NS 100 ML EPIDURAL INFUSION (ARMC-ANES)
EPIDURAL | Status: DC | PRN
Start: 2016-05-12 — End: 2016-05-12
  Administered 2016-05-12: 10 mL/h via EPIDURAL

## 2016-05-12 MED ORDER — TERBUTALINE SULFATE 1 MG/ML IJ SOLN: 0.2500 mg | Freq: Once | INTRAMUSCULAR | Status: DC | PRN

## 2016-05-12 MED ORDER — OXYTOCIN 40 UNITS IN LACTATED RINGERS INFUSION - SIMPLE MED
1.0000 m[IU]/min | INTRAVENOUS | Status: DC
  Administered 2016-05-12: 2 m[IU]/min via INTRAVENOUS

## 2016-05-12 MED ORDER — IBUPROFEN 600 MG PO TABS
600.0000 mg | ORAL_TABLET | Freq: Four times a day (QID) | ORAL | Status: DC
  Administered 2016-05-12 – 2016-05-14 (×6): 600 mg via ORAL
  Filled 2016-05-12 (×6): qty 1

## 2016-05-12 MED ORDER — ONDANSETRON HCL 4 MG/2ML IJ SOLN
4.0000 mg | INTRAMUSCULAR | Status: DC | PRN
  Administered 2016-05-13: 4 mg via INTRAVENOUS

## 2016-05-12 MED ORDER — SIMETHICONE 80 MG PO CHEW: 80.0000 mg | CHEWABLE_TABLET | ORAL | Status: DC | PRN

## 2016-05-12 MED ORDER — DIPHENHYDRAMINE HCL 25 MG PO CAPS: 25.0000 mg | ORAL_CAPSULE | Freq: Four times a day (QID) | ORAL | Status: DC | PRN

## 2016-05-12 MED ORDER — BENZOCAINE-MENTHOL 20-0.5 % EX AERO
1.0000 "application " | INHALATION_SPRAY | CUTANEOUS | Status: DC | PRN
  Administered 2016-05-13: 1 via TOPICAL
  Filled 2016-05-12: qty 56

## 2016-05-12 MED ORDER — DIPHENHYDRAMINE HCL 50 MG/ML IJ SOLN: 12.5000 mg | INTRAMUSCULAR | Status: DC | PRN

## 2016-05-12 MED ORDER — FENTANYL 2.5 MCG/ML W/ROPIVACAINE 0.2% IN NS 100 ML EPIDURAL INFUSION (ARMC-ANES)
EPIDURAL | Status: AC
  Administered 2016-05-12: 17:00:00
  Filled 2016-05-12: qty 100

## 2016-05-12 MED ORDER — LACTATED RINGERS IV SOLN: 500.0000 mL | Freq: Once | INTRAVENOUS | Status: DC

## 2016-05-12 NOTE — Progress Notes (Signed)
S:  Pt. Feel contractions, but not need medication at this time      Pt. States "I am starving"   O:  VS: Blood pressure (!) 106/59, pulse 73, temperature 97.8 F (36.6 C), temperature source Oral, resp. rate 18, height 5\' 6"  (1.676 m), weight 71.7 kg (158 lb), last menstrual period 08/29/2015.        FHR : baseline 135 bpm / variability moderate / accelerations + / no decelerations        Toco: contractions every 4-6 minutes / mild        Cervix : Dilation: 2 Effacement (%): 50 Cervical Position: Posterior Station: -3 Presentation: Vertex Exam by:: M. Zelma Mazariego CNM        Membranes: Intact   A: Latent labor     FHR category 1     IOL for symmetric IUGR  P: Pt. Okay to eat      Plan for Cytotec 25mcg buccally after meal     Discussed plan of care with Dr. Velta AddisonSchermerhorn  Gregory Dowe, CNM

## 2016-05-12 NOTE — Progress Notes (Signed)
I am assuming care of this patient with Dr. Feliberto GottronSchermerhorn as my supervising physician   S:  Hospital day #1 admitted for IOL for poor fetal growth, symmetric in the <5th%.  She states she is hurting with contractions.  She is "ready for this to be over."  She reports she normally comes to the hospital in active labor, so this is a different and longer experience.    O:  VS: Blood pressure (!) 106/59, pulse 73, temperature 97.9 F (36.6 C), temperature source Oral, resp. rate 18, height 5\' 6"  (1.676 m), weight 71.7 kg (158 lb), last menstrual period 08/29/2015.        FHR : baseline 135 bpm  / variability moderate / accelerations + / no decelerations        Toco: contractions every 4-11 minutes / mild         Cervix : Dilation: 2 Effacement (%): 70 Station: -2 Exam by:: T.Roberts RN        Membranes: Intact  A: Induction of labor     FHR category 1  P: Cervidil to be removed at 10am    Stadol PRN for pain       Will reassess at 10am for plan of care  Nancy Watson, CNM

## 2016-05-12 NOTE — Progress Notes (Signed)
Patient ID: Nancy Watson, female   DOB: 03/27/1981, 36 y.o.   MRN: [id number] Pt is scheduled for a BTL . I have counseled her for the risks of the procedure . All questions answered . NPO after 0200 this am . Schedule will probably go early afternoon tomorrow.

## 2016-05-12 NOTE — Anesthesia Procedure Notes (Signed)
Epidural Patient location during procedure: OB Start time: 05/12/2016 4:32 PM End time: 05/12/2016 4:55 PM  Staffing Resident/CRNA: Junious SilkNOLES, Joia Doyle Performed: resident/CRNA   Preanesthetic Checklist Completed: patient identified, site marked, surgical consent, pre-op evaluation, timeout performed, IV checked, risks and benefits discussed and monitors and equipment checked  Epidural Patient position: sitting Prep: Betadine Patient monitoring: heart rate, continuous pulse ox and blood pressure Approach: midline Location: L3-L4 Injection technique: LOR saline  Needle:  Needle type: Tuohy  Needle gauge: 17 G Needle length: 9 cm and 9 Catheter type: closed end flexible Catheter size: 20 Guage Test dose: negative and 1.5% lidocaine with Epi 1:200 K  Assessment Sensory level: T10 Events: blood not aspirated, injection not painful, no injection resistance, negative IV test and no paresthesia  Additional Notes   Patient tolerated the insertion well without complications.Reason for block:procedure for pain

## 2016-05-12 NOTE — Progress Notes (Signed)
Patient ID: Nancy Watson, female   DOB: 01/21/1981, 36 y.o.   MRN: [id number] Called by MS to eval pt for retained placenta  svd at 2054 . Post 30 minutes of placenta not being delivered. I came in to eval and meanwhile pt with more bleeding and Dr Valentino Saxonherry did a manual extraction . Placenta appeared fragmented but intact . No trailing membranes note on my exam . And good uterine tone noted . Minimal bleeding . Pt tolerated eval well .  Cont IV Pitocin and assess for future bleeding . Marland Kitchen. Still scheduled for elective BTL .

## 2016-05-12 NOTE — H&P (Signed)
OB History & Physical   History of Present Illness:  Chief Complaint:   HPI:  TELEAH VILLAMAR is a 36 y.o. [id number] female at [redacted]w[redacted]d dated by 1st trimester ultrasound, with unsure LMP of 07/26/15 and EDC of 06/01/16 .  She presents to L&D for scheduled induction of labor due to poor fetal growth as recommended by MFM.  +FM, occasional CTX, no LOF, no VB  Pregnancy Issues: 1. Early pregnancy radiation exposure 2. 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 Low PAPP-A,  4. Symmetric IUGR, normal dopplers <5%ile    Maternal Medical History:   Past Medical History:  Diagnosis Date  . Migraines   . Syncope     Past Surgical History:  Procedure Laterality Date  . DILATION AND CURETTAGE OF UTERUS  09/2015  . LAPAROSCOPIC SALPINGO OOPHERECTOMY Right 10/03/2015   Procedure:  D&C,right ovarian cystectomy laparoscopic ,laparoscopic excision right ovarian mass;  Surgeon: Suzy Bouchard, MD;  Location: [facility] ORS;  Service: Gynecology;  Laterality: Right;    No Known Allergies  Prior to Admission medications   Medication Sig Start Date End Date Taking? Authorizing Provider  ferrous sulfate 325 (65 FE) MG tablet Take 325 mg by mouth daily with breakfast.    Historical Provider, MD  Prenatal Vit-Fe Fumarate-FA (PRENATAL MULTIVITAMIN) TABS tablet Take 1 tablet by mouth every morning.     Historical Provider, MD     Prenatal care site: [facility]   Social History: She  reports that she has been smoking Cigarettes.  She has been smoking about 0.25 packs per day. She has never used smokeless tobacco. She reports that she does not drink alcohol or use drugs.  Family History: family history is not on file.   Review of Systems: A full review of systems was performed and negative except as noted in the HPI.     Physical Exam:  Vital Signs: Temp 98.8 F (37.1 C) (Oral)   Resp 20   Ht 5\' 6"  (1.676 m)   Wt 71.7 kg (158 lb)   LMP 08/29/2015 (Approximate)   BMI 25.50 kg/m   General: no acute distress.  HEENT: normocephalic, atraumatic Heart: regular rate & rhythm.  No murmurs/rubs/gallops Lungs: clear to auscultation bilaterally, normal respiratory effort Abdomen: soft, gravid, non-tender;  EFW: 5.10 Pelvic:   External: Normal external female genitalia  Cervix: 1/30/-3    Extremities: non-tender, symmetric, 0 edema bilaterally.  DTRs: 2+ Neurologic: Alert & oriented x 3.    Results for orders placed or performed during the hospital encounter of 05/11/16 (from the past 24 hour(s))  Type and screen [facility]     Status: None   Collection Time: 05/11/16 10:05 PM  Result Value Ref Range   ABO/RH(D) O POS    Antibody Screen NEG    Sample Expiration 05/14/2016   CBC     Status: Abnormal   Collection Time: 05/11/16 10:06 PM  Result Value Ref Range   WBC 7.4 3.6 - 11.0 K/uL   RBC 3.63 (L) 3.80 - 5.20 MIL/uL   Hemoglobin 10.8 (L) 12.0 - 16.0 g/dL   HCT 45.4 (L) 09.8 - 11.9 %   MCV 86.0 80.0 - 100.0 fL   MCH 29.7 26.0 - 34.0 pg   MCHC 34.5 32.0 - 36.0 g/dL   RDW 14.7 (H) 82.9 - 56.2 %   Platelets 310 150 - 440 K/uL    Pertinent Results:  Prenatal Labs: Blood type/Rh O+  Antibody screen neg  Rubella Immune  Varicella Immune  RPR NR  HBsAg Neg  HIV NR  GC neg  Chlamydia neg  Genetic screening negative  1 hour GTT 129  3 hour GTT --  GBS negative   FHT: 135 mod + accels no decels TOCO: q63min SVE: 1/30/-3   Cephalic by leopolds  [place] Mfm Ob Follow Up  Result Date: 04/13/2016 ----------------------------------------------------------------------  OBSTETRICS REPORT                      (Signed Final 04/13/2016 05:11 pm) ---------------------------------------------------------------------- PATIENT INFO:  ID #:       [id number]                          D.O.B.:  23-Jan-1981 (35 yrs)  Name:       MARCIANA UPLINGER [facility]                Visit Date: 04/13/2016 04:46 pm ----------------------------------------------------------------------  PERFORMED BY:  Performed By:     Baron Hamper Hickernell      Ref. Address:      [address]                                                              [place], [place],                                                              [place] [address]  Referred By:      Christeen Douglas                    MD ---------------------------------------------------------------------- SERVICE(S) PROVIDED:   [place] MFM OB FOLLOW UP                                   [phone]  ---------------------------------------------------------------------- INDICATIONS:   [redacted] weeks gestation of pregnancy                Z3A.33  ---------------------------------------------------------------------- FETAL EVALUATION:  Num Of Fetuses:     1  Fetal Heart         153  Rate(bpm):  Cardiac Activity:   Present  Presentation:       Cephalic  Placenta:           Anterior  Amniotic Fluid  AFI FV:      Normal  AFI Sum(cm)     %Tile  10.4            21 ---------------------------------------------------------------------- BIOMETRY:  BPD:      81.5  mm     G. Age:  32w 5d         36  %    CI:        76.78   %    70 - 86  FL/HC:       20.0  %    19.9 - 21.5  HC:      294.6  mm     G. Age:  32w 4d          9  %    HC/AC:       1.12       0.96 - 1.11  AC:      263.9  mm     G. Age:  30w 3d        < 3  %    FL/BPD:      72.4  %    71 - 87  FL:         59  mm     G. Age:  30w 6d        < 3  %    FL/AC:       22.4  %    20 - 24  HUM:      53.8  mm     G. Age:  31w 2d         25  %  Est. FW:    1674   gm   3 lb 11 oz       5  % ---------------------------------------------------------------------- GESTATIONAL AGE:  LMP:           37w 3d        Date:  07/26/15                 EDD:   05/01/16  U/S Today:     31w 5d                                        EDD:   06/10/16  Best:          33w 0d     Det. By:  Marcella DubsEarly Ultrasound         EDD:   06/01/16                                      (10/21/15)  ---------------------------------------------------------------------- ANATOMY:  Cavum:                 Visualized             Diaphragm:              Normal appearance                         previously  Ventricles:            Normal appearance      Stomach:                Seen  Cerebellum:            Visualized             Abdominal Wall:         Visualized                         previously  previously  Posterior Fossa:       Visualized             Cord Vessels:           3 vessels,                         previously                                     visualized previously  Face:                  Orbits visualized      Kidneys:                Normal appearance                         previously  Lips:                  Visualized             Bladder:                Seen                         previously  Heart:                 4-Chamber view         Spine:                  Suboptimal views                         appears normal  RVOT:                  Normal appearance      Upper Extremities:      Visualized                                                                        previously  LVOT:                  Normal appearance      Lower Extremities:      Visualized                                                                        previously ---------------------------------------------------------------------- DOPPLER - FETAL VESSELS:  Umbilical Artery   S/D     %tile  2.74       56 ---------------------------------------------------------------------- IMPRESSION:  Dear Dr. Dalbert Garnet,  Thank you for referring your patient to [facility] for a  fetal growth evaluation.  There is a singleton gestation with normal amniotic fluid  volume.  The estimated fetal weight is at the 5  percentile.  The abdominal circumference is less than the 3rd percentile.  This finding could be a sign of intrauterine growth restriction  (IUGR).  It must be noted that estimated fetal weight by  ultrasound and  actual birthweight can differ up to 15 percent.  The BPP was noted to be 8/8 which was reassuring.  The umbilical artery Doppler studies were within normal limits  (S/D = 2.74 )  Twice weekly testing (NSt or BPP) and weekly Doppler  studies is suggested. These have been arranged.  Serial growth scans are suggested (approximately every 3  weeks).  If there is no change in growth percentile, we will recommend  delivery at [redacted] weeks gestation.  Thank you for allowing [place] to participate in your patient's care.  Please do not hesitate to contact us if we can be of further  assistance.  Camelia Phenes, MD ----------------------------------------------------------------------                 Artemio Aly, MD Electronically Signed Final Report   04/13/2016 05:11 pm ----------------------------------------------------------------------  [place] Mfm Ob Limited  Result Date: 04/27/2016 ----------------------------------------------------------------------  OBSTETRICS REPORT                      (Signed Final 04/27/2016 04:16 pm) ---------------------------------------------------------------------- PATIENT INFO:  ID #:       [id number]                          D.O.B.:  09/18/1980 (35 yrs)  Name:       DERA VANAKEN [facility]                Visit Date: 04/27/2016 04:02 pm ---------------------------------------------------------------------- PERFORMED BY:  Performed By:     Baron Hamper Hickernell      Ref. Address:      [address]                                                              [place], [place],                                                              [place] [address]  Referred By:      Christeen Douglas                    MD ---------------------------------------------------------------------- SERVICE(S) PROVIDED:   [place] MFM OB LIMITED                                     [phone]  ---------------------------------------------------------------------- INDICATIONS:   [redacted] weeks gestation of pregnancy                 Z3A.35   IUGR  ---------------------------------------------------------------------- FETAL EVALUATION:  Num Of Fetuses:     1  Fetal Heart         135  Rate(bpm):  Presentation:       Cephalic  Placenta:  Anterior  AFI Sum(cm)     %Tile  7.7             5 ---------------------------------------------------------------------- GESTATIONAL AGE:  LMP:           39w 3d        Date:  07/26/15                 EDD:   05/01/16  Best:          Consuello Closs 0d     Det. ByMarcella Dubs         EDD:   06/01/16                                      (10/21/15) ---------------------------------------------------------------------- DOPPLER - FETAL VESSELS:  Umbilical Artery   S/D     %tile  2.56       55 ---------------------------------------------------------------------- IMPRESSION:  Dear Dr. Dalbert Garnet,  Thank you for referring your patient for NST , AFI and  umbilical artery Dopplers.  There is a singleton gestation with normal amniotic fluid  volume.  The umbilical artery Doppler studies were within normal limits  (S/D = 2.56 ) AFI = 7.7  NST is reactive with irregular  UCS  Pt was sent to L&D due to painful UCs.  Thank you for allowing [place] to participate in your patient's care.  Please do not hesitate to contact us if we can be of further  assistance. ----------------------------------------------------------------------              Jimmey Ralph, MD Electronically Signed Final Report   04/27/2016 04:16 pm ----------------------------------------------------------------------   Assessment:  Karisma Meiser Remington is a 36 y.o. [id number] female at 110w1d with IOL for fetopathy at term.   Plan:  1. Admit to Labor & Delivery 2. CBC, T&S, Clrs, IVF 3. GBS  Negative - antibiotics not indicated 4. Consents obtained. 5. Continuous efm/toco 6. Epidural upon request 7. IOL: cervidil for cervical ripening overnight,  Reassess in 12 hours or if labor status changes.   8. IUP: category 1  ----- Ranae Plumber,  MD Attending Obstetrician and Gynecologist [facility], Department of OB/GYN [facility]

## 2016-05-12 NOTE — Progress Notes (Signed)
S:  Comfortable with epidural      No augmentation since 1245 and contractions have slowed   O:  VS: Blood pressure (!) 102/53, pulse 80, temperature 98.2 F (36.8 C), temperature source Oral, resp. rate 18, height 5\' 6"  (1.676 m), weight 71.7 kg (158 lb), last menstrual period 08/29/2015, SpO2 100 %.        FHR : baseline 135 bpm / variability moderate / accelerations + / no decelerations        Toco: contractions every 3-8 minutes / mild-moderate         Cervix : Dilation: 4 Effacement (%): 80 Cervical Position: Posterior Station: -1 Presentation: Vertex Exam by:: M. Melaina Howerton, CNM         Membranes: AROM - for clear fluid   A: Latent labor     FHR category 1  P: Begin Pitocin at 2 milliunits and increase by 2 milliunits      Anticipate NSVD - planning for BTL tomorrow  Nancy Watson, CNM

## 2016-05-12 NOTE — Progress Notes (Signed)
Called to assess patient due to retained placenta (after 30 minutes) in utero s/p normal SVD.  Patient was noted to have episodic gushes of heavy bleeding.  I performed a manual extraction of the placenta without difficulty.  The placenta was removed fragmented, but intact.  Patient's bleeding was noted to be stabilized. Uterine fundus firm.  Dr. Feliberto GottronSchermerhorn arrived shortly after placenta delivered.  Care was handed over at this time.  To initiate Pitocin for routine postpartum management and monitor for further bleeding.    Hildred LaserAnika Ebany Bowermaster, MD [facility]

## 2016-05-12 NOTE — Anesthesia Preprocedure Evaluation (Signed)
Anesthesia Evaluation  Patient identified by MRN, date of birth, ID band Patient awake    Reviewed: Allergy & Precautions, NPO status , Patient's Chart, lab work & pertinent test results  History of Anesthesia Complications Negative for: history of anesthetic complications  Airway Mallampati: II  TM Distance: <3 FB Neck ROM: full    Dental no notable dental hx.    Pulmonary neg pulmonary ROS, Current Smoker,    Pulmonary exam normal        Cardiovascular negative cardio ROS Normal cardiovascular exam     Neuro/Psych negative neurological ROS  negative psych ROS   GI/Hepatic negative GI ROS, Neg liver ROS,   Endo/Other  negative endocrine ROS  Renal/GU negative Renal ROS  negative genitourinary   Musculoskeletal negative musculoskeletal ROS (+)   Abdominal   Peds negative pediatric ROS (+)  Hematology negative hematology ROS (+)   Anesthesia Other Findings   Reproductive/Obstetrics negative OB ROS                             Anesthesia Physical Anesthesia Plan  ASA: II  Anesthesia Plan: Epidural   Post-op Pain Management:    Induction:   Airway Management Planned:   Additional Equipment:   Intra-op Plan:   Post-operative Plan:   Informed Consent: I have reviewed the patients History and Physical, chart, labs and discussed the procedure including the risks, benefits and alternatives for the proposed anesthesia with the patient or authorized representative who has indicated his/her understanding and acceptance.     Plan Discussed with: CRNA and Anesthesiologist  Anesthesia Plan Comments:         Anesthesia Quick Evaluation

## 2016-05-13 ENCOUNTER — Inpatient Hospital Stay: Payer: Medicaid Other | Admitting: Anesthesiology

## 2016-05-13 ENCOUNTER — Encounter
Admission: EM | Disposition: A | Discharge status: Home / Self Care | Payer: Self-pay | Source: Home / Self Care | Attending: Obstetrics and Gynecology

## 2016-05-13 HISTORY — PX: TUBAL LIGATION: SHX77

## 2016-05-13 LAB — CBC
HCT: 27.7 % — ABNORMAL LOW (ref 35.0–47.0)
HCT: 27.1 % — ABNORMAL LOW (ref 35.0–47.0)
Hemoglobin: 9.2 g/dL — ABNORMAL LOW (ref 12.0–16.0)
Hemoglobin: 9.2 g/dL — ABNORMAL LOW (ref 12.0–16.0)
MCH: 29.2 pg (ref 26.0–34.0)
MCH: 29.4 pg (ref 26.0–34.0)
MCHC: 33.4 g/dL (ref 32.0–36.0)
MCHC: 34.1 g/dL (ref 32.0–36.0)
MCV: 87.4 fL (ref 80.0–100.0)
MCV: 86.3 fL (ref 80.0–100.0)
PLATELETS: 230 10*3/uL (ref 150–440)
PLATELETS: 233 10*3/uL (ref 150–440)
RBC: 3.17 MIL/uL — ABNORMAL LOW (ref 3.80–5.20)
RBC: 3.13 MIL/uL — ABNORMAL LOW (ref 3.80–5.20)
RDW: 14.8 % — ABNORMAL HIGH (ref 11.5–14.5)
RDW: 14.4 % (ref 11.5–14.5)
WBC: 10.1 10*3/uL (ref 3.6–11.0)
WBC: 10.4 10*3/uL (ref 3.6–11.0)

## 2016-05-13 LAB — RPR: RPR: NONREACTIVE

## 2016-05-13 SURGERY — LIGATION, FALLOPIAN TUBE, POSTPARTUM
Anesthesia: General | Laterality: Bilateral | Wound class: Clean Contaminated

## 2016-05-13 MED ORDER — DEXAMETHASONE SODIUM PHOSPHATE 10 MG/ML IJ SOLN
INTRAMUSCULAR | Status: AC
  Filled 2016-05-13: qty 1

## 2016-05-13 MED ORDER — PROPOFOL 10 MG/ML IV BOLUS
INTRAVENOUS | Status: DC | PRN
  Administered 2016-05-13: 120 mg via INTRAVENOUS

## 2016-05-13 MED ORDER — KETOROLAC TROMETHAMINE 30 MG/ML IJ SOLN
INTRAMUSCULAR | Status: AC
  Filled 2016-05-13: qty 1

## 2016-05-13 MED ORDER — ONDANSETRON HCL 4 MG/2ML IJ SOLN: 4.0000 mg | Freq: Once | INTRAMUSCULAR | Status: DC | PRN

## 2016-05-13 MED ORDER — FENTANYL CITRATE (PF) 100 MCG/2ML IJ SOLN
INTRAMUSCULAR | Status: DC | PRN
  Administered 2016-05-13: 100 ug via INTRAVENOUS

## 2016-05-13 MED ORDER — DEXAMETHASONE SODIUM PHOSPHATE 10 MG/ML IJ SOLN
INTRAMUSCULAR | Status: DC | PRN
  Administered 2016-05-13: 8 mg via INTRAVENOUS

## 2016-05-13 MED ORDER — ONDANSETRON HCL 4 MG/2ML IJ SOLN
INTRAMUSCULAR | Status: AC
  Filled 2016-05-13: qty 2

## 2016-05-13 MED ORDER — PROPOFOL 10 MG/ML IV BOLUS
INTRAVENOUS | Status: AC
  Filled 2016-05-13: qty 20

## 2016-05-13 MED ORDER — FENTANYL CITRATE (PF) 100 MCG/2ML IJ SOLN
25.0000 ug | INTRAMUSCULAR | Status: DC | PRN
  Administered 2016-05-13 (×4): 25 ug via INTRAVENOUS

## 2016-05-13 MED ORDER — SUCCINYLCHOLINE CHLORIDE 20 MG/ML IJ SOLN
INTRAMUSCULAR | Status: DC | PRN
Start: 2016-05-13 — End: 2016-05-13
  Administered 2016-05-13: 120 mg via INTRAVENOUS

## 2016-05-13 MED ORDER — MIDAZOLAM HCL 2 MG/2ML IJ SOLN
INTRAMUSCULAR | Status: DC | PRN
  Administered 2016-05-13: 2 mg via INTRAVENOUS

## 2016-05-13 MED ORDER — SUGAMMADEX SODIUM 200 MG/2ML IV SOLN
INTRAVENOUS | Status: AC
  Filled 2016-05-13: qty 2

## 2016-05-13 MED ORDER — LIDOCAINE HCL (PF) 2 % IJ SOLN
INTRAMUSCULAR | Status: AC
  Filled 2016-05-13: qty 2

## 2016-05-13 MED ORDER — BUPIVACAINE HCL 0.5 % IJ SOLN
INTRAMUSCULAR | Status: DC | PRN
  Administered 2016-05-13: 2 mL

## 2016-05-13 MED ORDER — KETOROLAC TROMETHAMINE 30 MG/ML IJ SOLN
INTRAMUSCULAR | Status: DC | PRN
  Administered 2016-05-13: 30 mg via INTRAVENOUS

## 2016-05-13 MED ORDER — FENTANYL CITRATE (PF) 100 MCG/2ML IJ SOLN
INTRAMUSCULAR | Status: AC
Start: 2016-05-13 — End: 2016-05-13
  Filled 2016-05-13: qty 2

## 2016-05-13 MED ORDER — SUGAMMADEX SODIUM 200 MG/2ML IV SOLN
INTRAVENOUS | Status: DC | PRN
Start: 2016-05-13 — End: 2016-05-13
  Administered 2016-05-13: 143.4 mg via INTRAVENOUS

## 2016-05-13 MED ORDER — FENTANYL CITRATE (PF) 100 MCG/2ML IJ SOLN
INTRAMUSCULAR | Status: AC
  Filled 2016-05-13: qty 2

## 2016-05-13 MED ORDER — MIDAZOLAM HCL 2 MG/2ML IJ SOLN
INTRAMUSCULAR | Status: AC
  Filled 2016-05-13: qty 2

## 2016-05-13 MED ORDER — LACTATED RINGERS IV SOLN
INTRAVENOUS | Status: DC | PRN
  Administered 2016-05-13: 12:00:00 via INTRAVENOUS

## 2016-05-13 MED ORDER — ROCURONIUM BROMIDE 100 MG/10ML IV SOLN
INTRAVENOUS | Status: DC | PRN
  Administered 2016-05-13: 25 mg via INTRAVENOUS
  Administered 2016-05-13: 5 mg via INTRAVENOUS

## 2016-05-13 MED ORDER — LIDOCAINE HCL (CARDIAC) 20 MG/ML IV SOLN
INTRAVENOUS | Status: DC | PRN
  Administered 2016-05-13: 50 mg via INTRAVENOUS

## 2016-05-13 SURGICAL SUPPLY — 31 items
APPLICATOR COTTON TIP 6IN STRL (MISCELLANEOUS) IMPLANT
BLADE SURG SZ11 CARB STEEL (BLADE) ×3 IMPLANT
CANISTER SUCT 1200ML W/VALVE (MISCELLANEOUS) ×3 IMPLANT
CHLORAPREP W/TINT 26ML (MISCELLANEOUS) ×3 IMPLANT
CLOSURE WOUND 1/4X4 (GAUZE/BANDAGES/DRESSINGS) ×1
DRAPE LAPAROTOMY 100X77 ABD (DRAPES) ×3 IMPLANT
DRSG TEGADERM 2-3/8X2-3/4 SM (GAUZE/BANDAGES/DRESSINGS) IMPLANT
DRSG TEGADERM 4X4.75 (GAUZE/BANDAGES/DRESSINGS) ×3 IMPLANT
ELECT CAUTERY BLADE 6.4 (BLADE) ×3 IMPLANT
ELECT REM PT RETURN 9FT ADLT (ELECTROSURGICAL) ×3
ELECTRODE REM PT RTRN 9FT ADLT (ELECTROSURGICAL) ×1 IMPLANT
GAUZE SPONGE NON-WVN 2X2 STRL (MISCELLANEOUS) ×1 IMPLANT
GLOVE BIO SURGEON STRL SZ8 (GLOVE) ×12 IMPLANT
GOWN STRL REUS W/ TWL LRG LVL3 (GOWN DISPOSABLE) ×1 IMPLANT
GOWN STRL REUS W/ TWL XL LVL3 (GOWN DISPOSABLE) ×1 IMPLANT
GOWN STRL REUS W/TWL LRG LVL3 (GOWN DISPOSABLE) ×2
GOWN STRL REUS W/TWL XL LVL3 (GOWN DISPOSABLE) ×2
KIT RM TURNOVER STRD PROC AR (KITS) ×3 IMPLANT
LABEL OR SOLS (LABEL) ×3 IMPLANT
LIQUID BAND (GAUZE/BANDAGES/DRESSINGS) ×3 IMPLANT
NEEDLE HYPO 25X1 1.5 SAFETY (NEEDLE) ×3 IMPLANT
NS IRRIG 500ML POUR BTL (IV SOLUTION) ×3 IMPLANT
PACK BASIN MINOR ARMC (MISCELLANEOUS) ×3 IMPLANT
SPONGE VERSALON 2X2 STRL (MISCELLANEOUS) ×2
STRIP CLOSURE SKIN 1/4X4 (GAUZE/BANDAGES/DRESSINGS) ×2 IMPLANT
SUT PLAIN GUT 0 (SUTURE) ×6 IMPLANT
SUT VIC AB 2-0 UR6 27 (SUTURE) ×3 IMPLANT
SUT VIC AB 4-0 SH 27 (SUTURE) ×2
SUT VIC AB 4-0 SH 27XANBCTRL (SUTURE) ×1 IMPLANT
SWABSTK COMLB BENZOIN TINCTURE (MISCELLANEOUS) ×3 IMPLANT
SYRINGE 10CC LL (SYRINGE) ×3 IMPLANT

## 2016-05-13 NOTE — Op Note (Signed)
[name]:  [place], [name]              ACCOUNT NO.:  [id number]  MEDICAL RECORD NO.:  [id number]  LOCATION:  340A                         FACILITY:  [facility]  PHYSICIAN:  Jennell Cornerhomas Dalaney Needle, MDDATE OF BIRTH:  02/17/1981  DATE OF PROCEDURE: DATE OF DISCHARGE:                              OPERATIVE REPORT   PREOPERATIVE DIAGNOSIS:  Elective permanent sterilization.  POSTOPERATIVE DIAGNOSIS:  Elective permanent sterilization.  PROCEDURE PERFORMED:  Postpartum bilateral tubal ligation.  SURGEON:  Jennell Cornerhomas Erum Cercone, MD  SURGEON:  Jennell Cornerhomas Jaevion Goto, MD.  ANESTHESIA:  General endotracheal anesthesia.  INDICATIONS:  A 36 year old, gravida 6, now para 4, status post spontaneous vaginal delivery on May 12, 2016.  The patient desires permanent sterilization and is aware of the potential risks of the procedure as well as possible failure rate.  DESCRIPTION OF PROCEDURE:  After adequate general endotracheal anesthesia, the patient was placed in dorsal supine position.  The abdomen was prepped and draped in normal sterile fashion.  Time-out was performed.  A 15 mm infraumbilical incision was made after injecting with 0.5% Marcaine.  The fascia was opened sharply as well as the peritoneum.  The right fallopian tube was identified in the fimbriated end.  Fimbriated end was visualized.  Two separate 0 plain gut sutures were placed in the midportion of the fallopian tube and a 1.5 cm portion of fallopian tube was removed.  Good hemostasis was noted.  Similar procedure was repeated on the patient's left fallopian tube after visualizing the fimbriated end.  Two separate 0 plain gut sutures were applied and 1.5 cm portion of fallopian tube was removed.  Good hemostasis was noted.  The fascia was closed with 2-0 Vicryl suture and the skin was reapproximated with interrupted 4-0 Vicryl suture.  There were no complications.  Estimated blood loss was minimal. Intraoperative fluids 400 mL.  The  patient was taken to recovery room in good condition.          ______________________________ Jennell Cornerhomas Trentin Knappenberger, MD     TS/MEDQ  D:  05/13/2016  T:  05/13/2016  Job:  [id number]

## 2016-05-13 NOTE — OR Nursing (Signed)
Patient IV infiltrated noted arrival to OR changed by Verlene MayerK Pope crna restarted in right hand

## 2016-05-13 NOTE — Anesthesia Postprocedure Evaluation (Signed)
Anesthesia Post Note  Patient: Nancy Watson  Procedure(s) Performed: Procedure(s) (LRB): POST PARTUM TUBAL LIGATION (Bilateral)  Patient location during evaluation: PACU Anesthesia Type: General Level of consciousness: awake and alert Pain management: pain level controlled Vital Signs Assessment: post-procedure vital signs reviewed and stable Respiratory status: spontaneous breathing, nonlabored ventilation, respiratory function stable and patient connected to nasal cannula oxygen Cardiovascular status: blood pressure returned to baseline and stable Postop Assessment: no signs of nausea or vomiting Anesthetic complications: no     Last Vitals:  Vitals:   05/13/16 1257 05/13/16 1300  BP:    Pulse:  80  Resp:  16  Temp: 36.9 C     Last Pain:  Vitals:   05/13/16 1300  TempSrc:   PainSc: 8                  Yevette EdwardsJames G Duquan Gillooly

## 2016-05-13 NOTE — Anesthesia Procedure Notes (Signed)
Procedure Name: Intubation Date/Time: 05/13/2016 1:02 PM Performed by: Allean Found Pre-anesthesia Checklist: Patient identified, Emergency Drugs available, Suction available, Patient being monitored and Timeout performed Patient Re-evaluated:Patient Re-evaluated prior to inductionOxygen Delivery Method: Circle system utilized Preoxygenation: Pre-oxygenation with 100% oxygen Intubation Type: IV induction Ventilation: Mask ventilation without difficulty Laryngoscope Size: Mac and 3 Grade View: Grade I Tube type: Oral Tube size: 7.0 mm Number of attempts: 1 Secured at: 21 cm Tube secured with: Tape Dental Injury: Teeth and Oropharynx as per pre-operative assessment

## 2016-05-13 NOTE — Progress Notes (Signed)
Patient requested to go downstairs to smoke. Patient educated on safety of staying on the floor. Patient still wants to go downstairs. Wheelchair offered but patient wants to walk. Patient off floor to go downstairs at 0945.

## 2016-05-13 NOTE — Anesthesia Post-op Follow-up Note (Cosign Needed)
Anesthesia QCDR form completed.        

## 2016-05-13 NOTE — Progress Notes (Signed)
PPD #1, SVD and PPH, baby boy "Kendrick"   S:  Reports feeling good, but states she is hungry              Tolerating po/ No nausea or vomiting             Bleeding is light             Pain controlled with Motrin and Percocet             Up ad lib / ambulatory / voiding QS  Newborn formula feeding   O:               VS: BP 99/66 (BP Location: Right Arm)   Pulse 63   Temp 98.2 F (36.8 C) (Oral)   Resp 18   Ht 5\' 6"  (1.676 m)   Wt 71.7 kg (158 lb)   LMP 08/29/2015 (Approximate)   SpO2 99%   Breastfeeding? Unknown   BMI 25.50 kg/m    LABS:              Recent Labs  05/13/16 0103 05/13/16 0536  WBC 10.1 10.4  HGB 9.2* 9.2*  PLT 230 233               Blood type: --/--/O POS (02/08 2205)  Rubella:   Immune                    I&O: Intake/Output      02/09 0701 - 02/10 0700 02/10 0701 - 02/11 0700   P.O.     I.V. (mL/kg)     Total Intake(mL/kg)     Urine (mL/kg/hr) 325 (0.2)    Blood 1130 (0.7)    Total Output 1455     Net -1455                        Physical Exam:             Alert and oriented X3  Lungs: Clear and unlabored  Heart: regular rate and rhythm / no mumurs  Abdomen: soft, non-tender, non-distended              Fundus: firm, non-tender, U-1  Perineum: intact, no significant edema or erythema  Lochia: appropriate  Extremities: no edema, no calf pain or tenderness    A: PPD #  1  Doing well - stable status  ABL Anemia s/p PPH - on FE BID  P: Routine post partum orders  Plan for PP BTL today at 12  Continue current care  Anticipate discharge home tomorrow  Carlean JewsMeredith Sigmon, CNM

## 2016-05-13 NOTE — Anesthesia Preprocedure Evaluation (Signed)
Anesthesia Evaluation  Patient identified by MRN, date of birth, ID band Patient awake    Reviewed: Allergy & Precautions, H&P , NPO status , Patient's Chart, lab work & pertinent test results, reviewed documented beta blocker date and time   Airway Mallampati: II  TM Distance: >3 FB Neck ROM: full    Dental  (+) Teeth Intact   Pulmonary neg pulmonary ROS, Current Smoker,    Pulmonary exam normal        Cardiovascular negative cardio ROS Normal cardiovascular exam Rhythm:regular Rate:Normal     Neuro/Psych negative neurological ROS  negative psych ROS   GI/Hepatic negative GI ROS, Neg liver ROS,   Endo/Other  negative endocrine ROS  Renal/GU negative Renal ROS  negative genitourinary   Musculoskeletal   Abdominal   Peds  Hematology negative hematology ROS (+)   Anesthesia Other Findings Past Medical History: No date: Migraines No date: Syncope Past Surgical History: 09/2015: DILATION AND CURETTAGE OF UTERUS 10/03/2015: LAPAROSCOPIC SALPINGO OOPHERECTOMY Right     Comment: Procedure:  D&C,right ovarian cystectomy               laparoscopic ,laparoscopic excision right               ovarian mass;  Surgeon: Suzy Bouchardhomas J Schermerhorn,               MD;  Location: [facility] ORS;  Service: Gynecology;               Laterality: Right; BMI    Body Mass Index:  25.50 kg/m     Reproductive/Obstetrics negative OB ROS                             Anesthesia Physical Anesthesia Plan  ASA: II  Anesthesia Plan: General ETT   Post-op Pain Management:    Induction:   Airway Management Planned:   Additional Equipment:   Intra-op Plan:   Post-operative Plan:   Informed Consent: I have reviewed the patients History and Physical, chart, labs and discussed the procedure including the risks, benefits and alternatives for the proposed anesthesia with the patient or authorized representative who has  indicated his/her understanding and acceptance.   Dental Advisory Given  Plan Discussed with: CRNA  Anesthesia Plan Comments:         Anesthesia Quick Evaluation

## 2016-05-13 NOTE — Anesthesia Postprocedure Evaluation (Signed)
Anesthesia Post Note  Patient: [name]  Procedure(s) Performed: * No procedures listed *  Patient location during evaluation: PACU Anesthesia Type: Epidural Level of consciousness: awake and alert Pain management: pain level controlled Vital Signs Assessment: post-procedure vital signs reviewed and stable Respiratory status: spontaneous breathing, nonlabored ventilation, respiratory function stable and patient connected to nasal cannula oxygen Cardiovascular status: blood pressure returned to baseline and stable Postop Assessment: no signs of nausea or vomiting Anesthetic complications: no     Last Vitals:  Vitals:   05/13/16 1116 05/13/16 1257  BP: 109/69   Pulse: 68   Resp: 17   Temp: 36.8 C 36.9 C    Last Pain:  Vitals:   05/13/16 1257  TempSrc:   PainSc: 0-No pain                 Yevette EdwardsJames G Adams

## 2016-05-13 NOTE — Brief Op Note (Signed)
05/11/2016 - 05/13/2016  12:45 PM  PATIENT:  Nancy Watson  36 y.o. female  PRE-OPERATIVE DIAGNOSIS:  patient desires sterilization  POST-OPERATIVE DIAGNOSIS:  patient desires sterilization  PROCEDURE:  Procedure(s): POST PARTUM TUBAL LIGATION (Bilateral)  SURGEON:  Surgeon(s) and Role:    Suzy Bouchard* Thomas J Schermerhorn, MD - Primary  PHYSICIAN ASSISTANT: scrub tech  ASSISTANTS: none   ANESTHESIA:   general  EBL:  Total I/O In: 300 [I.V.:300] Out: 650 [Urine:650]  Min EBL  BLOOD ADMINISTERED:none  DRAINS: none   LOCAL MEDICATIONS USED:  MARCAINE     SPECIMEN:  Source of Specimen:  portion right and left tube  DISPOSITION OF SPECIMEN:  PATHOLOGY  COUNTS:  YES  TOURNIQUET:  * No tourniquets in log *  DICTATION: .Other Dictation: Dictation Number verbal  PLAN OF CARE: Admit for overnight observation  PATIENT DISPOSITION:  PACU - hemodynamically stable.   Delay start of Pharmacological VTE agent (>24hrs) due to surgical blood loss or risk of bleeding: not applicable

## 2016-05-13 NOTE — Transfer of Care (Signed)
Immediate Anesthesia Transfer of Care Note  Patient: Nancy Watson  Procedure(s) Performed: Procedure(s): POST PARTUM TUBAL LIGATION (Bilateral)  Patient Location: PACU  Anesthesia Type:General  Level of Consciousness: awake  Airway & Oxygen Therapy: Patient Spontanous Breathing and Patient connected to face mask oxygen  Post-op Assessment: Report given to RN and Post -op Vital signs reviewed and stable  Post vital signs: Reviewed and stable  Last Vitals:  Vitals:   05/13/16 1116 05/13/16 1257  BP: 109/69   Pulse: 68   Resp: 17   Temp: 36.8 C 36.9 C    Last Pain:  Vitals:   05/13/16 1257  TempSrc:   PainSc: 0-No pain         Complications: No apparent anesthesia complications

## 2016-05-14 MED ORDER — FERROUS SULFATE 325 (65 FE) MG PO TABS: 325.0000 mg | ORAL_TABLET | Freq: Two times a day (BID) | ORAL | 3 refills | Status: DC

## 2016-05-14 MED ORDER — OXYCODONE-ACETAMINOPHEN 5-325 MG PO TABS: 1.0000 | ORAL_TABLET | ORAL | 0 refills | Status: DC | PRN

## 2016-05-14 MED ORDER — IBUPROFEN 600 MG PO TABS
600.0000 mg | ORAL_TABLET | Freq: Four times a day (QID) | ORAL | 0 refills | Status: DC
Start: 2016-05-14 — End: 2017-12-22

## 2016-05-14 NOTE — Progress Notes (Signed)
Discharge instructions complete and prescriptions given. Patient verbalizes understanding of teaching. Patient discharged home at 1345. 

## 2016-05-14 NOTE — Discharge Summary (Signed)
Obstetric Discharge Summary   Patient ID: Patient Name: Nancy Watson DOB: 09/04/1980 MRN: [id number]  Date of Admission: 05/11/2016 Date of Discharge: 05/14/2016  [facility]   Gestational Age at Delivery: 37+1 weels   Antepartum complications:  1. Early pregnancy radiation exposure 2. 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 Low PAPP-A,  4. Symmetric IUGR, normal dopplers <5%ile 5. D&C Admitting Diagnosis: IOL at 37 weeks for symmetric IUGR <5th%  Secondary Diagnoses: Patient Active Problem List   Diagnosis Date Noted  . Labor and delivery, indication for care 05/11/2016  . Indication for care in labor or delivery 04/27/2016  . Poor fetal growth affecting management of mother in third trimester 04/24/2016  . Abdominal pressure 03/24/2016  . Preterm labor 03/07/2016  . Abdominal pain affecting pregnancy 02/27/2016  . Pelvic pain 01/31/2016  . First trimester screening 11/18/2015  . Right ovarian cyst 10/03/2015  . Post-operative state 10/03/2015    Augmentation: AROM, Pitocin, Cytotec and Cervidil Complications: Hemorrhage>103200mL s/p retained placenta requiring manual extraction by Dr. Valentino Saxonherry and Dr. Feliberto GottronSchermerhorn  Intrapartum complications/course:  After AROM and initiation of Pitocin, patient progressed quickly to second stage of labor. Delivery of a viable female "Kendrick" at 2054 by Carlean JewsMeredith Sandhya Denherder, CNM in ROA position. Loose nuchal cord x1 - reduced over head.  The anterior shoulder and body quickly followed.  He was brought mom's abdomen and was dried and vigorously crying. Cord double clamped after cessation of pulsation, cut by Carlean JewsMeredith Farha Dano, CNM. Placenta was starting to detach at 30 minutes, but I felt like it was abnormally adherent to the uterine wall. There was a large blood clot forming inside the placenta and came down to the introitus.  I was able to track the umbilical cord but could not find clear margins. At 30 minutes, I  notified Dr. Feliberto GottronSchermerhorn and he was in route.  Prior to his arrival, bleeding became brisk and I called in Dr. Valentino Saxonherry at 2133, who was on the floor, for additional assessment.  Dr. Valentino Saxonherry manually extracted the placenta and teased out membranes at 2137, and Dr. Feliberto GottronSchermerhorn arrived immediately after. He also performed an assessment.  I used the speculum to visual cervix and assess for trailing membranes and retained tissue. Uterine tone was firm and Pitocin 500mL bolus infusing. Bleeding slowed.  No other uterotonic meds required.  Placenta disposition: surgical pathology  Date of Delivery: 05/12/16 Delivered By: Carlean JewsMeredith Seraphim Trow, CNM  Delivery Type: spontaneous vaginal delivery Anesthesia: epidural Placenta: sponatneous Laceration:  Episiotomy: none  Newborn Data: Live born female  Birth Weight: 5 lb 1.5 oz (2310 g) APGAR: 8, 9  Postpartum Course  Patient's postpartum course was complicated by ABL Anemia s/p PPH.  She had an elective bilateral tubal ligation on 05/13/16 for permanent sterilization without complications.  By time of discharge on PPD#2, her pain was controlled on oral pain medications; she had appropriate lochia and was ambulating, voiding without difficulty and tolerating regular diet.  She was deemed stable for discharge to home.     Labs: CBC Latest Ref Rng & Units 05/13/2016 05/13/2016 05/11/2016  WBC 3.6 - 11.0 K/uL 10.4 10.1 7.4  Hemoglobin 12.0 - 16.0 g/dL 0.1(U9.2(L) 2.7(O9.2(L) 10.8(L)  Hematocrit 35.0 - 47.0 % 27.1(L) 27.7(L) 31.2(L)  Platelets 150 - 440 K/uL 233 230 310   O POS  Physical exam:  BP 114/61 (BP Location: Right Arm)   Pulse (!) 59   Temp 98.7 F (37.1 C) (Oral)   Resp 18  Ht 5\' 6"  (1.676 m)   Wt 71.7 kg (158 lb)   SpO2 100%   Breastfeeding? Unknown   BMI 25.50 kg/m  General: alert and no distress Pulm: normal respiratory effort Lochia: appropriate Abdomen: soft, NT Uterine Fundus: firm, below umbilicus Umbilical Incision: c/d/i, healing well, no  significant drainage, no dehiscence, no significant erythema, steri strips intact  Extremities: No evidence of DVT seen on physical exam. No lower extremity edema.   Disposition: stable, discharge to home Baby Feeding: formula Baby Disposition: home with mom  Contraception: BTL on 05/13/16  Prenatal Labs:  Blood type/Rh O+  Antibody screen neg  Rubella Immune  Varicella Immune  RPR NR  HBsAg Neg  HIV NR  GC neg  Chlamydia neg  Genetic screening negative  1 hour GTT 129  3 hour GTT --  GBS negative      Plan:  Nancy Watson was discharged to home in good condition. Follow-up appointment at [facility] in 2 weeks for Postop visit with Dr. Feliberto Gottron, then in 6 weeks for [facility] visit   Discharge Instructions: Per After Visit Summary. Activity: Advance as tolerated. Pelvic rest for 6 weeks.  Refer to After Visit Summary Diet: Regular Discharge Medications: Allergies as of 05/14/2016   No Known Allergies     Medication List    TAKE these medications   ferrous sulfate 325 (65 FE) MG tablet Take 1 tablet (325 mg total) by mouth 2 (two) times daily with a meal. What changed:  when to take this   ibuprofen 600 MG tablet Commonly known as:  ADVIL,MOTRIN Take 1 tablet (600 mg total) by mouth every 6 (six) hours.   oxyCODONE-acetaminophen 5-325 MG tablet Commonly known as:  PERCOCET/ROXICET Take 1-2 tablets by mouth every 4 (four) hours as needed (pain scale 4-7).   prenatal multivitamin Tabs tablet Take 1 tablet by mouth every morning.      Outpatient follow up:  Follow-up Information    SCHERMERHORN,THOMAS, MD. Schedule an appointment as soon as possible for a visit in 2 day(s).   Specialty:  Obstetrics and Gynecology Why:  Post-op visit for BTL  Contact information: [address] [facility] [place] [address] [phone]        Karena Addison, [place]. Schedule an appointment as soon as possible for a visit in 6  week(s).   Specialty:  Certified Nurse Midwife Why:  Postpartum visit Contact information: [address] [place] [address] [phone]            Signed:  Karena Addison

## 2016-05-14 NOTE — Discharge Instructions (Signed)
Care After Vaginal Delivery °Congratulations on your new baby!! ° °Refer to this sheet in the next few weeks. These discharge instructions provide you with information on caring for yourself after delivery. Your caregiver may also give you specific instructions. Your treatment has been planned according to the most current medical practices available, but problems sometimes occur. Call your caregiver if you have any problems or questions after you go home. ° °HOME CARE INSTRUCTIONS °· Take over-the-counter or prescription medicines only as directed by your caregiver or pharmacist. °· Do not drink alcohol, especially if you are breastfeeding or taking medicine to relieve pain. °· Do not chew or smoke tobacco. °· Do not use illegal drugs. °· Continue to use good perineal care. Good perineal care includes: °¨ Wiping your perineum from front to back. °¨ Keeping your perineum clean. °· Do not use tampons or douche until your caregiver says it is okay. °· Shower, wash your hair, and take tub baths as directed by your caregiver. °· Wear a well-fitting bra that provides breast support. °· Eat healthy foods. °· Drink enough fluids to keep your urine clear or pale yellow. °· Eat high-fiber foods such as whole grain cereals and breads, brown rice, beans, and fresh fruits and vegetables every day. These foods may help prevent or relieve constipation. °· Follow your caregiver's recommendations regarding resumption of activities such as climbing stairs, driving, lifting, exercising, or traveling. Specifically, no driving for two weeks, so that you are comfortable reacting quickly in an emergency. °· Talk to your caregiver about resuming sexual activities. Resumption of sexual activities is dependent upon your risk of infection, your rate of healing, and your comfort and desire to resume sexual activity. Usually we recommend waiting about six weeks, or until your bleeding stops and you are interested in sex. °· Try to have someone  help you with your household activities and your newborn for at least a few days after you leave the hospital. Even longer is better. °· Rest as much as possible. Try to rest or take a nap when your newborn is sleeping. Sleep deprivation can be very hard after delivery. °· Increase your activities gradually. °· Keep all of your scheduled postpartum appointments. It is very important to keep your scheduled follow-up appointments. At these appointments, your caregiver will be checking to make sure that you are healing physically and emotionally. ° °SEEK MEDICAL CARE IF:  °· You are passing large clots from your vagina.  °· You have a foul smelling discharge from your vagina. °· You have trouble urinating. °· You are urinating frequently. °· You have pain when you urinate. °· You have a change in your bowel movements. °· You have increasing redness, pain, or swelling near your vaginal incision (episiotomy) or vaginal tear. °· You have pus draining from your episiotomy or vaginal tear. °· Your episiotomy or vaginal tear is separating. °· You have painful, hard, or reddened breasts. °· You have a severe headache. °· You have blurred vision or see spots. °· You feel sad or depressed. °· You have thoughts of hurting yourself or your newborn. °· You have questions about your care, the care of your newborn, or medicines. °· You are dizzy or light-headed. °· You have a rash. °· You have nausea or vomiting. °· You were breastfeeding and have not had a menstrual period within 12 weeks after you stopped breastfeeding. °· You are not breastfeeding and have not had a menstrual period by the 12th week after delivery. °· You   have a fever. ° °SEEK IMMEDIATE MEDICAL CARE IF:  °· You have persistent pain. °· You have chest pain. °· You have shortness of breath. °· You faint. °· You have leg pain. °· You have stomach pain. °· Your vaginal bleeding saturates two or more sanitary pads in 1 hour. ° °MAKE SURE YOU:  °· Understand these  instructions. °· Will get help right away if you are not doing well or get worse. °·  °Document Released: 03/17/2000 Document Revised: 08/04/2013 Document Reviewed: 11/15/2011 ° °ExitCare® Patient Information ©2015 [employer]. This information is not intended to replace advice given to you by your health care provider. Make sure you discuss any questions you have with your health care provider. ° °

## 2016-05-15 ENCOUNTER — Encounter: Payer: Self-pay | Admitting: Obstetrics and Gynecology

## 2016-05-16 LAB — SURGICAL PATHOLOGY

## 2016-09-29 ENCOUNTER — Other Ambulatory Visit: Payer: Self-pay | Admitting: Family Medicine

## 2016-09-29 ENCOUNTER — Ambulatory Visit
Admission: RE | Admit: 2016-09-29 | Discharge: 2016-09-29 | Disposition: A | Discharge status: Home / Self Care | Payer: Medicaid Other | Source: Ambulatory Visit | Attending: Family Medicine | Admitting: Family Medicine

## 2016-09-29 DIAGNOSIS — N200 Calculus of kidney: Secondary | ICD-10-CM

## 2016-09-29 DIAGNOSIS — R1031 Right lower quadrant pain: Secondary | ICD-10-CM

## 2016-11-07 ENCOUNTER — Emergency Department: Payer: Medicaid Other

## 2016-11-07 ENCOUNTER — Encounter: Payer: Self-pay | Admitting: *Deleted

## 2016-11-07 ENCOUNTER — Emergency Department
Admission: EM | Admit: 2016-11-07 | Discharge: 2016-11-07 | Disposition: A | Discharge status: Home / Self Care | Payer: Medicaid Other | Attending: Emergency Medicine | Admitting: Emergency Medicine

## 2016-11-07 DIAGNOSIS — Y999 Unspecified external cause status: Secondary | ICD-10-CM | POA: Diagnosis not present

## 2016-11-07 DIAGNOSIS — S9032XA Contusion of left foot, initial encounter: Secondary | ICD-10-CM | POA: Insufficient documentation

## 2016-11-07 DIAGNOSIS — Y929 Unspecified place or not applicable: Secondary | ICD-10-CM | POA: Insufficient documentation

## 2016-11-07 DIAGNOSIS — Y939 Activity, unspecified: Secondary | ICD-10-CM | POA: Diagnosis not present

## 2016-11-07 DIAGNOSIS — Z79899 Other long term (current) drug therapy: Secondary | ICD-10-CM | POA: Diagnosis not present

## 2016-11-07 DIAGNOSIS — W2203XA Walked into furniture, initial encounter: Secondary | ICD-10-CM | POA: Diagnosis not present

## 2016-11-07 DIAGNOSIS — S90935A Unspecified superficial injury of left lesser toe(s), initial encounter: Secondary | ICD-10-CM | POA: Diagnosis present

## 2016-11-07 DIAGNOSIS — F17210 Nicotine dependence, cigarettes, uncomplicated: Secondary | ICD-10-CM | POA: Diagnosis not present

## 2016-11-07 MED ORDER — MELOXICAM 7.5 MG PO TABS: 7.5000 mg | ORAL_TABLET | Freq: Every day | ORAL | 1 refills | Status: AC

## 2016-11-07 NOTE — ED Provider Notes (Signed)
[facility] Emergency Department Provider Note  ____________________________________________  Time seen: Approximately 10:05 PM  I have reviewed the triage vital signs and the nursing notes.   HISTORY  Chief Complaint Toe Injury    HPI Nancy Watson is a 36 y.o. female presents to the emergency department with left fourth and fifth digit pain after patient accidentally struck her foot against an entertainment center earlier today. Patient denies weakness, radiculopathy or changes in sensation of the left lower extremity. Patient rates her pain at 10 out of 10 in intensity. No alleviating measures have been attempted.   Past Medical History:  Diagnosis Date  . Migraines   . Syncope     Patient Active Problem List   Diagnosis Date Noted  . Labor and delivery, indication for care 05/11/2016  . Indication for care in labor or delivery 04/27/2016  . Poor fetal growth affecting management of mother in third trimester 04/24/2016  . Abdominal pressure 03/24/2016  . Preterm labor 03/07/2016  . Abdominal pain affecting pregnancy 02/27/2016  . Pelvic pain 01/31/2016  . First trimester screening 11/18/2015  . Right ovarian cyst 10/03/2015  . Post-operative state 10/03/2015    Past Surgical History:  Procedure Laterality Date  . DILATION AND CURETTAGE OF UTERUS  09/2015  . LAPAROSCOPIC SALPINGO OOPHERECTOMY Right 10/03/2015   Procedure:  D&C,right ovarian cystectomy laparoscopic ,laparoscopic excision right ovarian mass;  Surgeon: Suzy Bouchard, MD;  Location: [facility] ORS;  Service: Gynecology;  Laterality: Right;  . TUBAL LIGATION Bilateral 05/13/2016   Procedure: POST PARTUM TUBAL LIGATION;  Surgeon: Suzy Bouchard, MD;  Location: [facility] ORS;  Service: Gynecology;  Laterality: Bilateral;    Prior to Admission medications   Medication Sig Start Date End Date Taking? Authorizing Provider  ferrous sulfate 325 (65 FE) MG tablet Take 1 tablet (325 mg  total) by mouth 2 (two) times daily with a meal. 05/14/16   Sigmon, Meredith C, CNM  ibuprofen (ADVIL,MOTRIN) 600 MG tablet Take 1 tablet (600 mg total) by mouth every 6 (six) hours. 05/14/16   Sigmon, Scarlette Slice, CNM  meloxicam (MOBIC) 7.5 MG tablet Take 1 tablet (7.5 mg total) by mouth daily. 11/07/16 11/14/16  Orvil Feil, PA-C  oxyCODONE-acetaminophen (PERCOCET/ROXICET) 5-325 MG tablet Take 1-2 tablets by mouth every 4 (four) hours as needed (pain scale 4-7). 05/14/16   Karena Addison, CNM  Prenatal Vit-Fe Fumarate-FA (PRENATAL MULTIVITAMIN) TABS tablet Take 1 tablet by mouth every morning.     [provider]    Allergies Patient has no known allergies.  No family history on file.  Social History Social History  Substance Use Topics  . Smoking status: Current Every Day Smoker    Packs/day: 0.25    Types: Cigarettes  . Smokeless tobacco: Never Used  . Alcohol use No     Review of Systems  Constitutional: No fever/chills Eyes: No visual changes. No discharge ENT: No upper respiratory complaints. Cardiovascular: no chest pain. Respiratory: no cough. No SOB. Musculoskeletal: Patient has left foot pain.  Skin: Negative for rash, abrasions, lacerations, ecchymosis. Neurological: Negative for headaches, focal weakness or numbness.   ____________________________________________   PHYSICAL EXAM:  VITAL SIGNS: ED Triage Vitals  Enc Vitals Group     BP 11/07/16 1949 131/62     Pulse Rate 11/07/16 1949 85     Resp 11/07/16 1949 18     Temp 11/07/16 1949 98.5 F (36.9 C)     Temp Source 11/07/16 1949 Oral  SpO2 11/07/16 1949 99 %     Weight 11/07/16 1946 145 lb (65.8 kg)     Height 11/07/16 1946 5\' 6"  (1.676 m)     Head Circumference --      Peak Flow --      Pain Score 11/07/16 1946 10     Pain Loc --      Pain Edu? --      Excl. in GC? --      Constitutional: Alert and oriented. Well appearing and in no acute distress. Eyes: Conjunctivae are  normal. PERRL. EOMI. Head: Atraumatic. Cardiovascular: Normal rate, regular rhythm. Normal S1 and S2.  Good peripheral circulation. Respiratory: Normal respiratory effort without tachypnea or retractions. Lungs CTAB. Good air entry to the bases with no decreased or absent breath sounds. Musculoskeletal: She is able to perform full range of motion at the left ankle. Patient is able to move all 5 left toes. Has mild tenderness to palpation over the fourth and fifth left digits. Palpable dorsalis pedis pulse bilaterally and symmetrically. Neurologic:  Normal speech and language. No gross focal neurologic deficits are appreciated.  Skin:  Skin is warm, dry and intact. No rash noted. Psychiatric: Mood and affect are normal. Speech and behavior are normal. Patient exhibits appropriate insight and judgement.   ____________________________________________   LABS (all labs ordered are listed, but only abnormal results are displayed)  Labs Reviewed - No data to display ____________________________________________  EKG   ____________________________________________  RADIOLOGY Geraldo Pitter, personally viewed and evaluated these images (plain radiographs) as part of my medical decision making, as well as reviewing the written report by the radiologist.  Dg Foot Complete Left  Result Date: 11/07/2016 CLINICAL DATA:  Left fourth and fifth toe pain after hitting toes on an entertainment center today. EXAM: LEFT FOOT - COMPLETE 3+ VIEW COMPARISON:  None. FINDINGS: There is no evidence of acute fracture or dislocation. There is no evidence of arthropathy or other focal bone abnormality. Soft tissues are unremarkable. IMPRESSION: No radiographically apparent fracture nor dislocations of the left foot. Electronically Signed   By: Tollie Eth M.D.   On: 11/07/2016 21:56    ____________________________________________    PROCEDURES  Procedure(s) performed:    Procedures    Medications - No  data to display   ____________________________________________   INITIAL IMPRESSION / ASSESSMENT AND PLAN / ED COURSE  Pertinent labs & imaging results that were available during my care of the patient were reviewed by me and considered in my medical decision making (see chart for details).  Review of the [place] CSRS was performed in accordance of the [facility] prior to dispensing any controlled drugs.     Assessment and plan Left Foot Contusion  Patient presents to the emergency department with left foot pain after patient accidentally struck her foot against an entertainment center. DG left foot reveals no acute fractures or bony abnormalities. A postop shoe was provided in the emergency department. Patient was discharged with Mobic. A referral was given to podiatry, Dr. Orland Jarred. Vital signs are reassuring at discharge. All patient questions were answered.   ____________________________________________  FINAL CLINICAL IMPRESSION(S) / ED DIAGNOSES  Final diagnoses:  Contusion of left foot, initial encounter      NEW MEDICATIONS STARTED DURING THIS VISIT:  Discharge Medication List as of 11/07/2016 10:09 PM    START taking these medications   Details  meloxicam (MOBIC) 7.5 MG tablet Take 1 tablet (7.5 mg total) by mouth daily., Starting Tue 11/07/2016, Until  Tue 11/14/2016, Print            This chart was dictated using voice recognition software/Dragon. Despite best efforts to proofread, errors can occur which can change the meaning. Any change was purely unintentional.    Orvil FeilWoods, Marjean Imperato M, PA-C 11/07/16 2333    Minna AntisPaduchowski, Kevin, MD 11/08/16 2216

## 2016-11-07 NOTE — ED Notes (Signed)
Went into room to review discharge instructions and give prescription to patient. Patient not in room. Not in restrooms. Not in lobby. PA notified and aware. PA states she went over her discharge instructions with the patient. However, patient did not receive mobic prescriptions.

## 2016-11-07 NOTE — ED Triage Notes (Signed)
Pt has pain in left fourth and 5th toes.  Pt hit toes on entertainment center.  No swelling noted.

## 2017-11-25 IMAGING — CT CT ABD-PELV W/ CM
2 of 4 series · 16 of 46 positions shown, 18 images · IV contrast (iopamidol)
Comparison: Ultrasound 10/03/2015 and 09/21/2015.

CLINICAL DATA: Ectopic pregnancy.  Right adnexal cyst.

EXAM:
CT ABDOMEN AND PELVIS WITH CONTRAST
TECHNIQUE: Multidetector CT imaging of the abdomen and pelvis was performed
using the standard protocol following bolus administration of
intravenous contrast.
CONTRAST:  100mL UNRBC7-M00 IOPAMIDOL (UNRBC7-M00) INJECTION 61%

[Series 2: routine abd pel with · axial · 0.65mm/px · z∈[-1114,-744]mm · 13 of 82 slices shown, 15 images]
[im 4/82  soft-tissue]
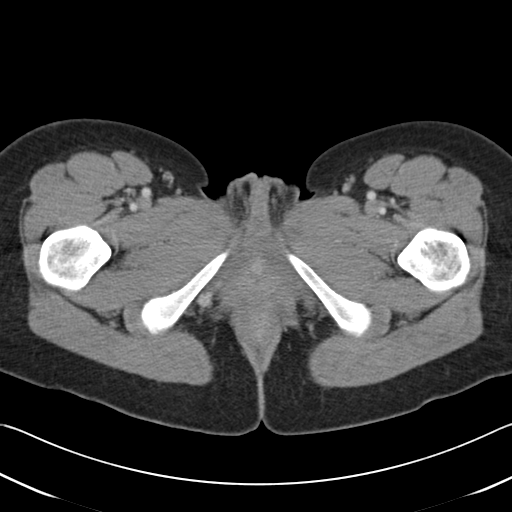
[im 4/82  bone]
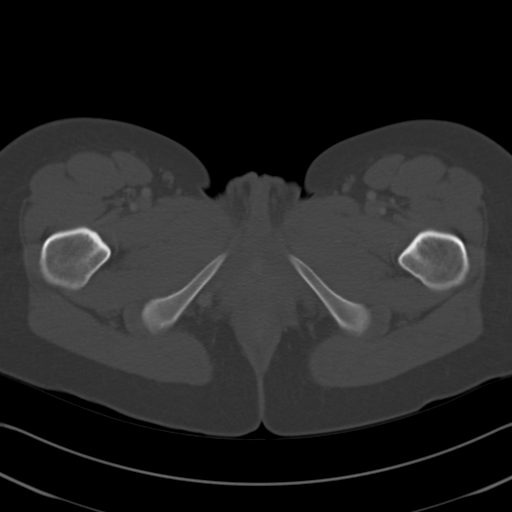
[im 10/82  soft-tissue]
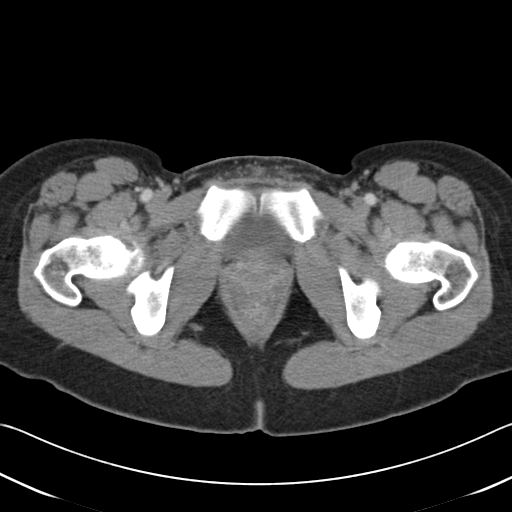
[im 17/82  soft-tissue]
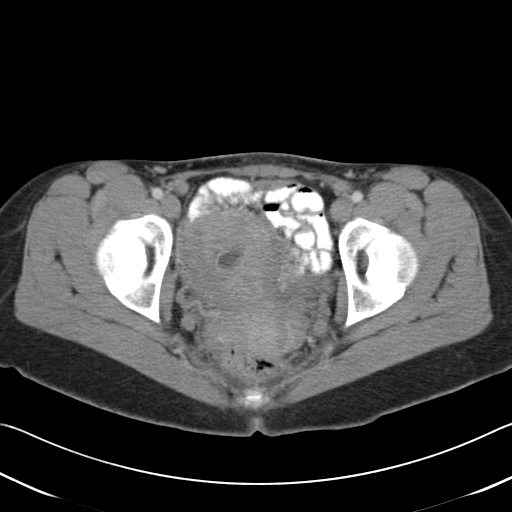
[im 23/82  soft-tissue]
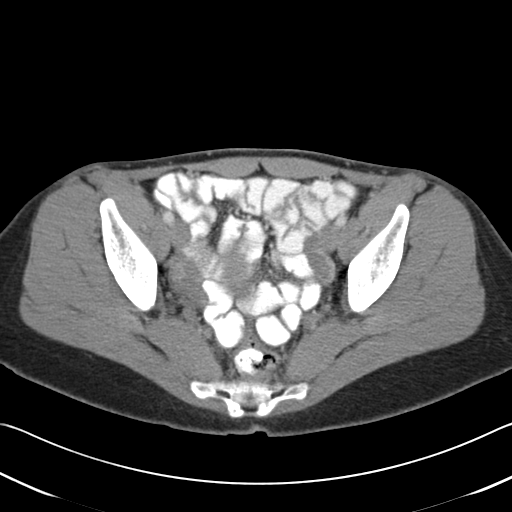
[im 30/82  soft-tissue]
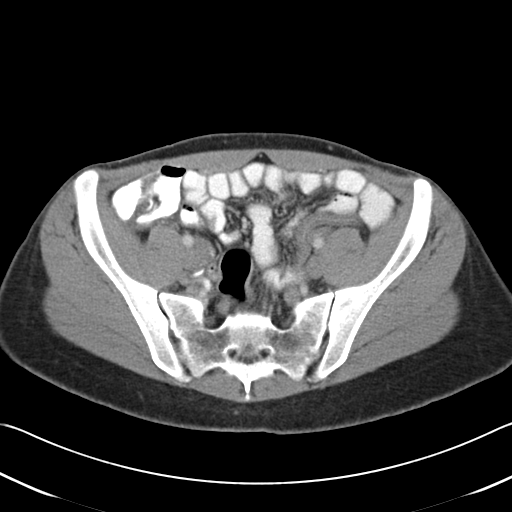
[im 36/82  soft-tissue]
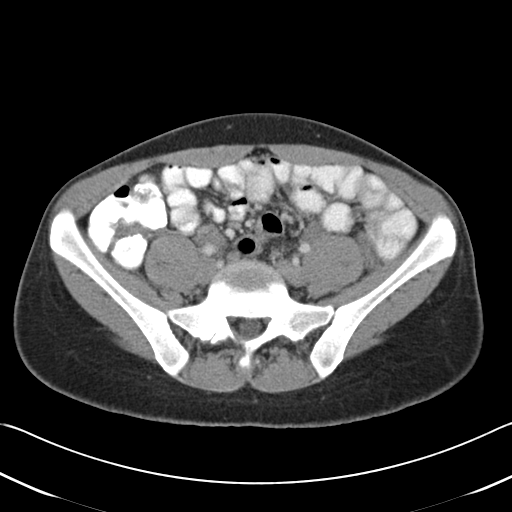
[im 43/82  soft-tissue]
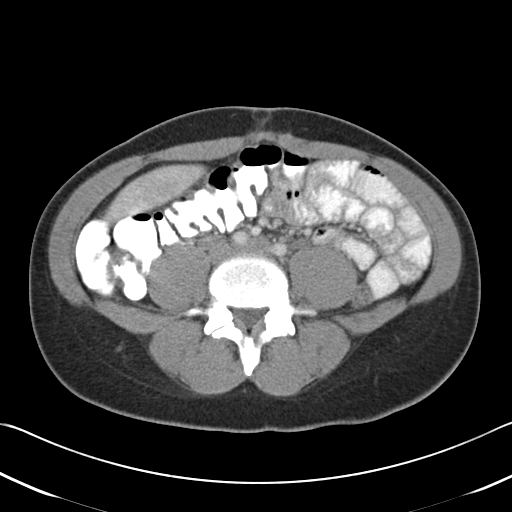
[im 46/82  soft-tissue]
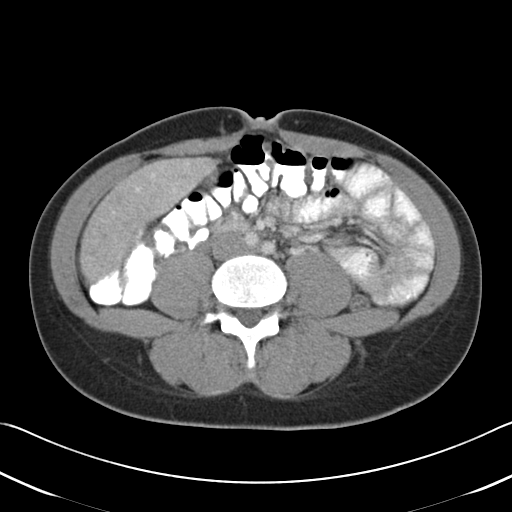
[im 52/82  soft-tissue]
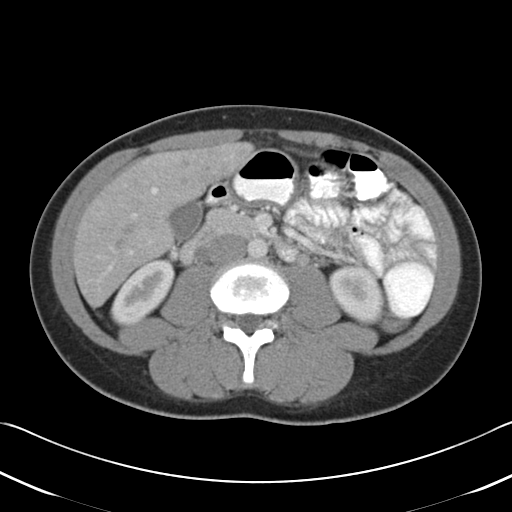
[im 52/82  bone]
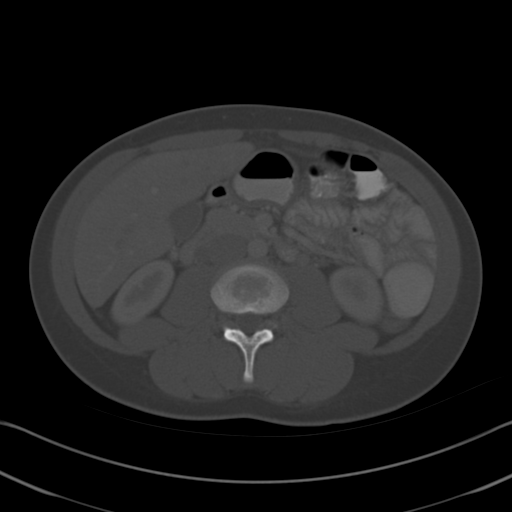
[im 59/82  soft-tissue]
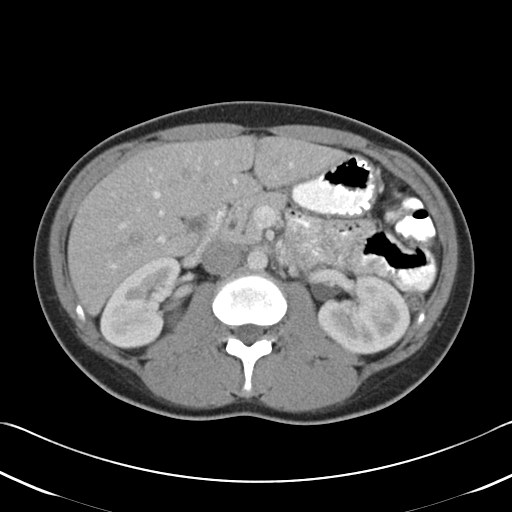
[im 65/82  soft-tissue]
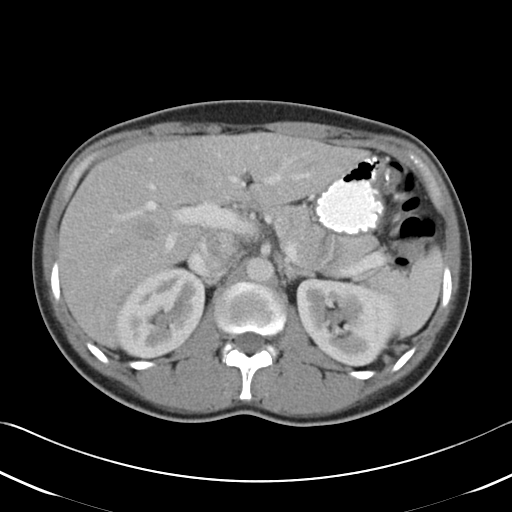
[im 72/82  soft-tissue]
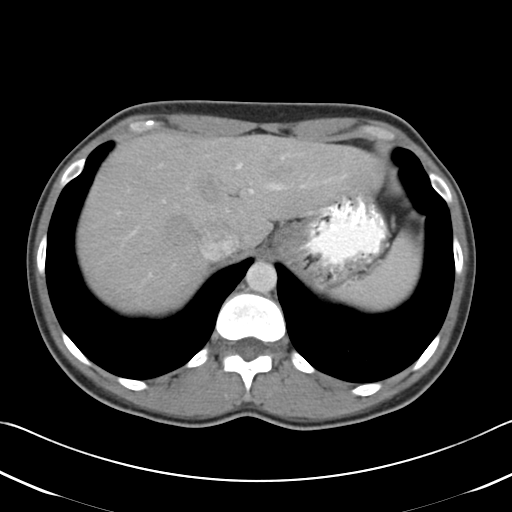
[im 78/82  soft-tissue]
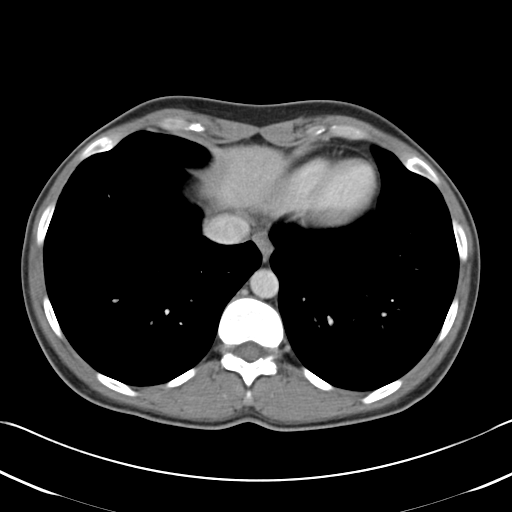

[Series 5: cor routine abd pel with · coronal · 0.59mm/px · 3 of 108 slices shown]
[im 36/108  soft-tissue]
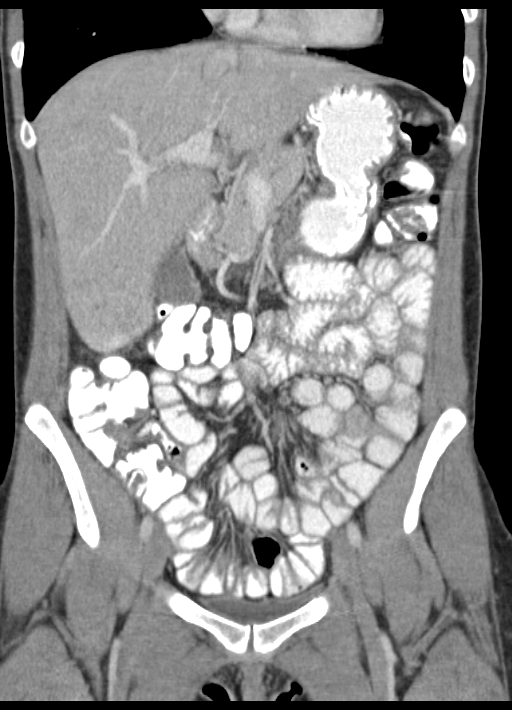
[im 48/108  soft-tissue]
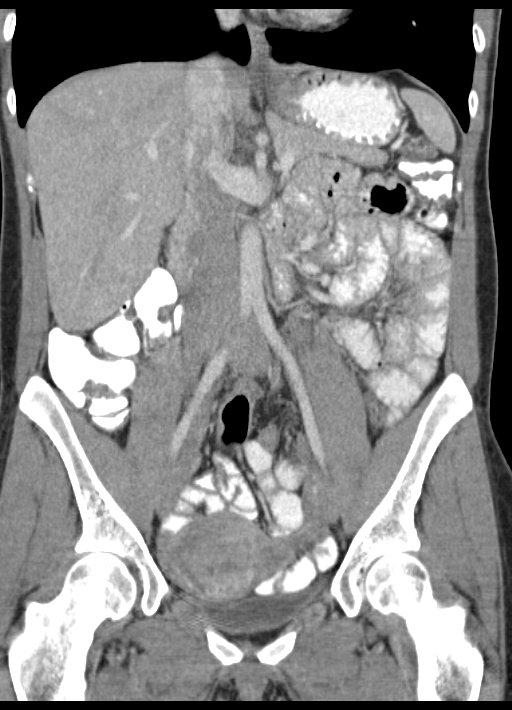
[im 60/108  soft-tissue]
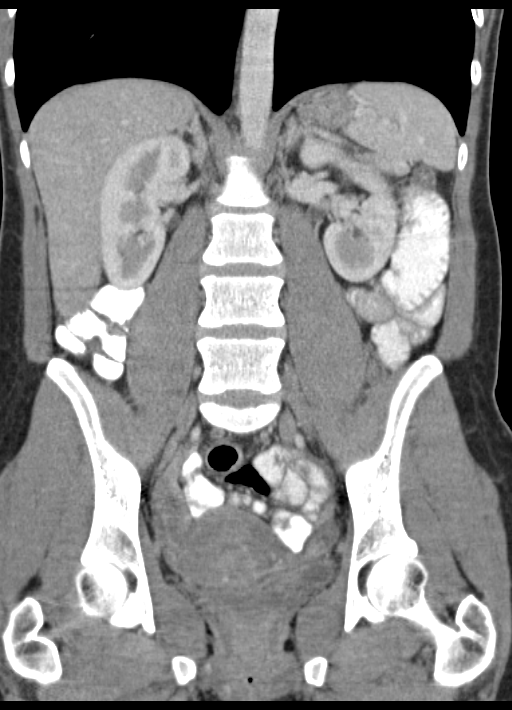

[16 of 46 positions shown; findings below may reference images not displayed]

FINDINGS: Lung bases are clear.  No pleural or pericardial fluid.

The liver has normal appearance. No calcified gallstones. The spleen
is normal. The pancreas is normal. The adrenal glands are normal.
The kidneys are normal. The aorta and IVC are normal. No bowel
pathology is seen. Normal appearing appendix. Nonspecific injured
uterine endometrial region fluid collection measuring 1 cm.
Gestational sac or pseudo gestational sac is certainly possible. No
adnexal lesion. 5 x 7 cm cyst previously seen in the right adnexal
region no longer present. Tiny amount of free fluid in the cul de
sac.

No bone abnormality.
IMPRESSION: No abdominal organ pathology.  No bowel pathology.

1 cm fluid collection in the uterine endometrial region. Gestational
sac or pseudo gestational sac is possible based on this appearance.
Completely nonspecific by CT. Consider repeat ultrasound if not
done.

There is no longer any adnexal cyst as was seen at the previous
ultrasound. There is only a tiny amount of free fluid in the pelvic
cul de sac. There is no evidence of any diagnosable ectopic
pregnancy by CT.

These results were called by telephone at the time of interpretation
on 10/08/2015 at [DATE] to Dr. DEIVIS MORKUNAS BASINSKAITE , who verbally
acknowledged these results.At the time of the call report, it was
disclosed that the adnexal cyst was removed laparoscopically.

## 2017-12-22 ENCOUNTER — Other Ambulatory Visit: Payer: Self-pay

## 2017-12-22 ENCOUNTER — Encounter: Payer: Self-pay | Admitting: Emergency Medicine

## 2017-12-22 ENCOUNTER — Emergency Department
Admission: EM | Admit: 2017-12-22 | Discharge: 2017-12-22 | Disposition: A | Discharge status: Home / Self Care | Payer: Medicaid Other | Attending: Student in an Organized Health Care Education/Training Program | Admitting: Student in an Organized Health Care Education/Training Program

## 2017-12-22 ENCOUNTER — Emergency Department: Payer: Medicaid Other

## 2017-12-22 DIAGNOSIS — R51 Headache: Secondary | ICD-10-CM | POA: Insufficient documentation

## 2017-12-22 DIAGNOSIS — Z79899 Other long term (current) drug therapy: Secondary | ICD-10-CM | POA: Diagnosis not present

## 2017-12-22 DIAGNOSIS — R519 Headache, unspecified: Secondary | ICD-10-CM

## 2017-12-22 DIAGNOSIS — F17210 Nicotine dependence, cigarettes, uncomplicated: Secondary | ICD-10-CM | POA: Diagnosis not present

## 2017-12-22 LAB — POCT PREGNANCY, URINE: PREG TEST UR: NEGATIVE

## 2017-12-22 MED ORDER — KETOROLAC TROMETHAMINE 30 MG/ML IJ SOLN
30.0000 mg | Freq: Once | INTRAMUSCULAR | Status: AC
  Administered 2017-12-22: 30 mg via INTRAVENOUS
  Filled 2017-12-22: qty 1

## 2017-12-22 MED ORDER — DIPHENHYDRAMINE HCL 50 MG/ML IJ SOLN
25.0000 mg | Freq: Once | INTRAMUSCULAR | Status: AC
  Administered 2017-12-22: 25 mg via INTRAVENOUS
  Filled 2017-12-22: qty 1

## 2017-12-22 MED ORDER — SODIUM CHLORIDE 0.9 % IV BOLUS
1000.0000 mL | Freq: Once | INTRAVENOUS | Status: AC
  Administered 2017-12-22: 1000 mL via INTRAVENOUS

## 2017-12-22 MED ORDER — METOCLOPRAMIDE HCL 5 MG/ML IJ SOLN
10.0000 mg | Freq: Once | INTRAMUSCULAR | Status: AC
  Administered 2017-12-22: 10 mg via INTRAVENOUS
  Filled 2017-12-22: qty 2

## 2017-12-22 MED ORDER — ONDANSETRON 4 MG PO TBDP
4.0000 mg | ORAL_TABLET | Freq: Once | ORAL | Status: AC
  Administered 2017-12-22: 4 mg via ORAL
  Filled 2017-12-22: qty 1

## 2017-12-22 NOTE — ED Provider Notes (Signed)
[facility] Emergency Department Provider Note  ____________________________________________   First MD Initiated Contact with Patient 12/22/17 1238     (approximate)  I have reviewed the triage vital signs and the nursing notes.   HISTORY  Chief Complaint Headache  HPI Nancy Watson is a 37 y.o. female resents to the ED with complaint of right-sided headache after waking up from a nap approximately 1 hour prior to her arrival.  She states that she has history of migraine headaches but is never had a CT done.  She states that this is more severe than her usual migraine.  She also endorses photophobia and nausea.  Patient has not taken any medication this morning.  She states she [facility].  Currently she rates her pain as 9/10.  Past Medical History:  Diagnosis Date  . Migraines   . Syncope     Patient Active Problem List   Diagnosis Date Noted  . Labor and delivery, indication for care 05/11/2016  . Indication for care in labor or delivery 04/27/2016  . Poor fetal growth affecting management of mother in third trimester 04/24/2016  . Abdominal pressure 03/24/2016  . Preterm labor 03/07/2016  . Abdominal pain affecting pregnancy 02/27/2016  . Pelvic pain 01/31/2016  . First trimester screening 11/18/2015  . Right ovarian cyst 10/03/2015  . Post-operative state 10/03/2015    Past Surgical History:  Procedure Laterality Date  . DILATION AND CURETTAGE OF UTERUS  09/2015  . LAPAROSCOPIC SALPINGO OOPHERECTOMY Right 10/03/2015   Procedure:  D&C,right ovarian cystectomy laparoscopic ,laparoscopic excision right ovarian mass;  Surgeon: Suzy Bouchardhomas J Schermerhorn, MD;  Location: [facility] ORS;  Service: Gynecology;  Laterality: Right;  . TUBAL LIGATION Bilateral 05/13/2016   Procedure: POST PARTUM TUBAL LIGATION;  Surgeon: Suzy Bouchardhomas J Schermerhorn, MD;  Location: [facility] ORS;  Service: Gynecology;  Laterality: Bilateral;    Prior to Admission  medications   Medication Sig Start Date End Date Taking? Authorizing Provider  ferrous sulfate 325 (65 FE) MG tablet Take 1 tablet (325 mg total) by mouth 2 (two) times daily with a meal. 05/14/16   Sigmon, Scarlette SliceMeredith C, CNM  Prenatal Vit-Fe Fumarate-FA (PRENATAL MULTIVITAMIN) TABS tablet Take 1 tablet by mouth every morning.     [provider]    Allergies Patient has no known allergies.  No family history on file.  Social History Social History   Tobacco Use  . Smoking status: Current Every Day Smoker    Packs/day: 0.25    Types: Cigarettes  . Smokeless tobacco: Never Used  Substance Use Topics  . Alcohol use: No  . Drug use: No    Review of Systems Constitutional: No fever/chills Eyes: Positive photophobia. ENT: No sore throat.  He did ear pain. Cardiovascular: Denies chest pain. Respiratory: Denies shortness of breath.  Negative upper respiratory symptoms. Gastrointestinal: No abdominal pain.  Positive nausea, no vomiting.  No diarrhea.  Genitourinary: Negative for dysuria. Musculoskeletal: Negative for back pain. Skin: Negative for rash. Neurological: Positive for acute right-sided headache, no focal weakness or numbness. ____________________________________________   PHYSICAL EXAM:  VITAL SIGNS: ED Triage Vitals [12/22/17 1231]  Enc Vitals Group     BP 134/89     Pulse Rate 89     Resp 20     Temp 98.1 F (36.7 C)     Temp Source Oral     SpO2 99 %     Weight 160 lb (72.6 kg)  Height 5\' 6"  (1.676 m)     Head Circumference      Peak Flow      Pain Score 9     Pain Loc      Pain Edu?      Excl. in GC?    Constitutional: Alert and oriented. Well appearing and in no acute distress. Eyes: Conjunctivae are normal. PERRL. EOMI. patient is cooperative but photophobic. Head: Atraumatic. Nose: No congestion/rhinnorhea. Mouth/Throat: Mucous membranes are moist.  Oropharynx non-erythematous. Neck: No stridor.  No cervical tenderness on palpation  posteriorly. Hematological/Lymphatic/Immunilogical: No cervical lymphadenopathy. Cardiovascular: Normal rate, regular rhythm. Grossly normal heart sounds.  Good peripheral circulation. Respiratory: Normal respiratory effort.  No retractions. Lungs CTAB. Musculoskeletal: Moves upper and lower extremities with any difficulty.  Good muscle strength bilaterally.  Nontender to palpation thoracic or lumbar spine. Neurologic:  Normal speech and language. No gross focal neurologic deficits are appreciated.  Cranial nerves II through XII grossly intact.  No gait instability. Skin:  Skin is warm, dry and intact. No rash noted. Psychiatric: Mood and affect are normal. Speech and behavior are normal.  ____________________________________________   LABS (all labs ordered are listed, but only abnormal results are displayed)  Labs Reviewed  POC URINE PREG, ED  POCT PREGNANCY, URINE    RADIOLOGY  Official radiology report(s): Ct Head Wo Contrast  Result Date: 12/22/2017 CLINICAL DATA:  Migraine headache, photophobia EXAM: CT HEAD WITHOUT CONTRAST TECHNIQUE: Contiguous axial images were obtained from the base of the skull through the vertex without intravenous contrast. COMPARISON:  03/23/2007 FINDINGS: Brain: No evidence of acute infarction, hemorrhage, hydrocephalus, extra-axial collection or mass lesion/mass effect. Vascular: No hyperdense vessel or unexpected calcification. Skull: Normal. Negative for fracture or focal lesion. Sinuses/Orbits: The visualized paranasal sinuses are essentially clear. The mastoid air cells are unopacified. Other: None. IMPRESSION: Normal head CT. Electronically Signed   By: Charline Bills M.D.   On: 12/22/2017 13:55  ____________________________________________   PROCEDURES  Procedure(s) performed: None  Procedures  Critical Care performed: No  ____________________________________________   INITIAL IMPRESSION / ASSESSMENT AND PLAN / ED COURSE  As part of  my medical decision making, I reviewed the following data within the electronic MEDICAL RECORD NUMBER Notes from prior ED visits and [place] Controlled Substance Database  Patient presents with acute onset of right-sided posterior headache after waking up from a nap.  Patient states she was diagnosed with having migraines in the past and has never had a CT scan.  In looking at her chart space at [facility] and at [facility] it does not appear that she has had a CT scan.  I do not see a diagnosis of migraine headache.  Patient does endorse nausea but no vomiting.  Patient is photophobic and answers questions in short answers.  Neurologically she is intact.  CT scan was negative and reassuring.  Patient was given headache cocktail with Reglan, Toradol, Benadryl along with a liter of normal saline.  She was much better after receiving this medication.  She is to follow-up with her PCP if any continued problems.  No prescription medications were prescribed. ____________________________________________   FINAL CLINICAL IMPRESSION(S) / ED DIAGNOSES  Final diagnoses:  Acute nonintractable headache, unspecified headache type     ED Discharge Orders    None       Note:  This document was prepared using Dragon voice recognition software and may include unintentional dictation errors.    Tommi Rumps, PA-C 12/22/17 1623    Willy Eddy, MD  12/23/17 1005  

## 2017-12-22 NOTE — ED Notes (Signed)
Pt given a cup for a urine sample.

## 2017-12-22 NOTE — Discharge Instructions (Signed)
Follow-up with your primary care provider if any continued problems.  You need to call Monday to make an appointment.  Increase fluids.  Continue with Tylenol or ibuprofen as needed.

## 2017-12-22 NOTE — ED Notes (Signed)
Pt unable to sign discharge - computer crashed. Pt given discharge instructions with follow up to pcp

## 2017-12-22 NOTE — ED Triage Notes (Signed)
Awoke from nap approx 1 hour ago with R sided headache. States is like her typical migraine except more severe than usual. Photophobia.

## 2017-12-22 NOTE — ED Notes (Signed)
Pt in with headache, she and her two children placed into room 44. Pt is not photophobic, just holding the side of head and stating it hurts.

## 2018-03-07 ENCOUNTER — Emergency Department: Payer: Medicaid Other

## 2018-03-07 ENCOUNTER — Other Ambulatory Visit: Payer: Self-pay

## 2018-03-07 ENCOUNTER — Inpatient Hospital Stay
Admission: EM | Admit: 2018-03-07 | Discharge: 2018-03-09 | DRG: 201 | Disposition: A | Discharge status: Home / Self Care | Payer: Medicaid Other | Attending: Surgery | Admitting: Surgery

## 2018-03-07 DIAGNOSIS — J939 Pneumothorax, unspecified: Principal | ICD-10-CM

## 2018-03-07 DIAGNOSIS — F17210 Nicotine dependence, cigarettes, uncomplicated: Secondary | ICD-10-CM | POA: Diagnosis present

## 2018-03-07 DIAGNOSIS — R079 Chest pain, unspecified: Secondary | ICD-10-CM

## 2018-03-07 DIAGNOSIS — Z9689 Presence of other specified functional implants: Secondary | ICD-10-CM

## 2018-03-07 LAB — BASIC METABOLIC PANEL
ANION GAP: 8 (ref 5–15)
BUN: 13 mg/dL (ref 6–20)
CHLORIDE: 107 mmol/L (ref 98–111)
CO2: 20 mmol/L — AB (ref 22–32)
Calcium: 8.2 mg/dL — ABNORMAL LOW (ref 8.9–10.3)
Creatinine, Ser: 0.7 mg/dL (ref 0.44–1.00)
GFR calc non Af Amer: >60 mL/min (ref >60)
Glucose, Bld: 94 mg/dL (ref 70–99)
POTASSIUM: 3.7 mmol/L (ref 3.5–5.1)
Sodium: 135 mmol/L (ref 135–145)

## 2018-03-07 LAB — CBC
HEMATOCRIT: 33.9 % — AB (ref 36.0–46.0)
Hemoglobin: 11.6 g/dL — ABNORMAL LOW (ref 12.0–15.0)
MCH: 28.4 pg (ref 26.0–34.0)
MCHC: 34.2 g/dL (ref 30.0–36.0)
MCV: 83.1 fL (ref 80.0–100.0)
NRBC: 0 % (ref 0.0–0.2)
Platelets: 285 10*3/uL (ref 150–400)
RBC: 4.08 MIL/uL (ref 3.87–5.11)
RDW: 14.3 % (ref 11.5–15.5)
WBC: 6.9 10*3/uL (ref 4.0–10.5)

## 2018-03-07 LAB — TROPONIN I: Troponin I: <0.03 ng/mL (ref <0.03)

## 2018-03-07 MED ORDER — KETOROLAC TROMETHAMINE 30 MG/ML IJ SOLN
30.0000 mg | Freq: Four times a day (QID) | INTRAMUSCULAR | Status: DC
  Administered 2018-03-07 – 2018-03-09 (×8): 30 mg via INTRAVENOUS
  Filled 2018-03-07 (×8): qty 1

## 2018-03-07 MED ORDER — OXYCODONE HCL 5 MG PO TABS
5.0000 mg | ORAL_TABLET | Freq: Once | ORAL | Status: AC
  Administered 2018-03-07: 5 mg via ORAL
  Filled 2018-03-07: qty 1

## 2018-03-07 MED ORDER — ENOXAPARIN SODIUM 40 MG/0.4ML ~~LOC~~ SOLN: 40.0000 mg | SUBCUTANEOUS | Status: DC

## 2018-03-07 MED ORDER — METOPROLOL TARTRATE 5 MG/5ML IV SOLN: 5.0000 mg | Freq: Four times a day (QID) | INTRAVENOUS | Status: DC | PRN

## 2018-03-07 MED ORDER — FENTANYL CITRATE (PF) 100 MCG/2ML IJ SOLN
50.0000 ug | Freq: Once | INTRAMUSCULAR | Status: AC
  Administered 2018-03-07: 50 ug via INTRAVENOUS

## 2018-03-07 MED ORDER — ONDANSETRON HCL 4 MG/2ML IJ SOLN: 4.0000 mg | Freq: Four times a day (QID) | INTRAMUSCULAR | Status: DC | PRN

## 2018-03-07 MED ORDER — OXYCODONE HCL 5 MG PO TABS
5.0000 mg | ORAL_TABLET | ORAL | Status: DC | PRN
  Administered 2018-03-07: 10 mg via ORAL
  Administered 2018-03-07: 5 mg via ORAL
  Administered 2018-03-08 – 2018-03-09 (×4): 10 mg via ORAL
  Filled 2018-03-07: qty 2
  Filled 2018-03-07: qty 1
  Filled 2018-03-07 (×4): qty 2

## 2018-03-07 MED ORDER — KETOROLAC TROMETHAMINE 30 MG/ML IJ SOLN: 30.0000 mg | Freq: Four times a day (QID) | INTRAMUSCULAR | Status: DC | PRN

## 2018-03-07 MED ORDER — ACETAMINOPHEN 500 MG PO TABS
1000.0000 mg | ORAL_TABLET | Freq: Four times a day (QID) | ORAL | Status: DC
  Administered 2018-03-07 – 2018-03-09 (×7): 1000 mg via ORAL
  Filled 2018-03-07 (×8): qty 2

## 2018-03-07 MED ORDER — FENTANYL CITRATE (PF) 100 MCG/2ML IJ SOLN
50.0000 ug | Freq: Once | INTRAMUSCULAR | Status: AC
  Administered 2018-03-07: 50 ug via INTRAVENOUS
  Filled 2018-03-07: qty 2

## 2018-03-07 MED ORDER — ONDANSETRON 4 MG PO TBDP: 4.0000 mg | ORAL_TABLET | Freq: Four times a day (QID) | ORAL | Status: DC | PRN

## 2018-03-07 MED ORDER — METHOCARBAMOL 500 MG PO TABS
500.0000 mg | ORAL_TABLET | Freq: Four times a day (QID) | ORAL | Status: DC | PRN
  Filled 2018-03-07: qty 1

## 2018-03-07 MED ORDER — FERROUS SULFATE 325 (65 FE) MG PO TABS
325.0000 mg | ORAL_TABLET | Freq: Two times a day (BID) | ORAL | Status: DC
  Administered 2018-03-08 – 2018-03-09 (×3): 325 mg via ORAL
  Filled 2018-03-07 (×4): qty 1

## 2018-03-07 MED ORDER — LIDOCAINE-EPINEPHRINE 1 %-1:100000 IJ SOLN
20.0000 mL | Freq: Once | INTRAMUSCULAR | Status: AC
  Administered 2018-03-07: 20 mL via INTRADERMAL
  Filled 2018-03-07: qty 20

## 2018-03-07 MED ORDER — OXYCODONE HCL 5 MG PO TABS: 5.0000 mg | ORAL_TABLET | Freq: Once | ORAL | Status: DC

## 2018-03-07 NOTE — ED Notes (Signed)
PA at bedside.

## 2018-03-07 NOTE — ED Notes (Signed)
Floor notified primary RN would be transporting pt and giving bedside report

## 2018-03-07 NOTE — ED Triage Notes (Signed)
Pt presents today via POV for Chest pain that started yesterday. Pt states she didn't sleep. Pt states the pain is throbbing and aching. Pt states it is only in the center of her chest. Pt is NAD at this time.

## 2018-03-07 NOTE — ED Notes (Signed)
.  Labs sent at this time. RN will monitor for results.   

## 2018-03-07 NOTE — Procedures (Signed)
Procedure Note  Procedures: Left chest tube insertion, left intercostal nerve block (4-6)  Indications:  Clinically significant Pneumothorax  Pre-operative Diagnosis: Pneumothorax (left)  Post-operative Diagnosis: Pneumothorax (Left)  Authorizing Provider: Dr Sterling Bigiego Pabon, MD  Preforming Provider: Lynden OxfordZachary Schulz, PA-C  Procedure Details  Informed consent was obtained for the procedure, including sedation.  Risks of lung perforation, hemorrhage, arrhythmia, and adverse drug reaction were discussed.   After sterile skin prep and the patient was prepped and draped in typical sterile fashion an intercostal nerve block was preformed for ribs 4-6 using a 22 gauge needle and 20 ccs of 1% lidocaine with epinephrine. Following this, using standard technique, a 14 French tube was placed in the left lateral 4/5th rib space. The chest tube was then secured using 1-0 silk sutures and connected to 20 cm of suction. Chest tube placement was confirmed by CXR.   Findings: minimal serosanguinous fluid obtained  Estimated Blood Loss:  Minimal         Specimens:  None              Complications:  None; patient tolerated the procedure well.         Disposition: Admit to med-surg under general surgery         Condition: stable  -- Lynden OxfordZachary Schulz, PA-C [facility] 03/07/2018, 11:49 AM [phone] M-F: 7am - 4pm

## 2018-03-07 NOTE — H&P (Addendum)
[facility] Admission Note  Nancy Watson 01/31/1981  [id number].    Requesting MD: Dr Nancy CoasterGrayson Goodman, MD Chief Complaint/Reason for Consult: Left Pneumothorax  HPI:  Nancy Watson is a 37 y.o. female who [facility] ED this morning with complaints of left lateral chest pain and SOB. She reports that yesterday she was making her bed when she noticed the acute onset of SOB which she described as feeling a heaviness on her chest, but she did not think much of it. Around 2 am this morning she noticed a dull ache in her left lateral chest which was concerning. These symptoms are made worse by deep inspiration. Nothing seems to provide relief and she has never had anything like this before. She denied any trauma. No complaints of fevers, chills, abdominal pain, nausea, emesis, or peripheral edema. Of note, she endorses smoking about 6 cigarettes a day for the last 12 years.   General surgery is consulted by emergency medicine physician Dr Nancy CoasterGrayson Goodman, MD for evaluation and management of left pneumothorax.   ROS: Review of Systems  Constitutional: Negative for chills and fever.  Respiratory: Positive for shortness of breath. Negative for cough and wheezing.   Cardiovascular: Positive for chest pain. Negative for palpitations and leg swelling.  Gastrointestinal: Negative for abdominal pain, nausea and vomiting.  Musculoskeletal: Negative for myalgias.  All other systems reviewed and are negative.   No family history on file.  Past Medical History:  Diagnosis Date  . Migraines   . Syncope     Past Surgical History:  Procedure Laterality Date  . DILATION AND CURETTAGE OF UTERUS  09/2015  . LAPAROSCOPIC SALPINGO OOPHERECTOMY Right 10/03/2015   Procedure:  D&C,right ovarian cystectomy laparoscopic ,laparoscopic excision right ovarian mass;  Surgeon: Nancy Bouchardhomas J Schermerhorn, MD;  Location: [facility] ORS;  Service: Gynecology;  Laterality: Right;  . TUBAL LIGATION  Bilateral 05/13/2016   Procedure: POST PARTUM TUBAL LIGATION;  Surgeon: Nancy Bouchardhomas J Schermerhorn, MD;  Location: [facility] ORS;  Service: Gynecology;  Laterality: Bilateral;    Social History:  reports that she has been smoking cigarettes. She has been smoking about 0.25 packs per day. She has never used smokeless tobacco. She reports that she does not drink alcohol or use drugs.  Allergies: No Known Allergies   (Not in a hospital admission)  Blood pressure (!) 133/107, pulse 86, temperature 98 F (36.7 C), temperature source Oral, resp. rate (!) 22, height 5\' 6"  (1.676 m), weight 72.6 kg, last menstrual period 02/09/2018, SpO2 100 %, not currently breastfeeding.   Physical Exam: Physical Exam  Constitutional: She is oriented to person, place, and time. She appears well-developed and well-nourished. No distress.  HENT:  Head: Normocephalic and atraumatic.  Eyes: Pupils are equal, round, and reactive to light.  Cardiovascular: Normal rate and regular rhythm. Exam reveals no gallop, no S3 and no S4.  No murmur heard. Pulmonary/Chest: Effort normal. No respiratory distress. She has decreased breath sounds in the left upper field. She has no rhonchi. She has no rales.  Abdominal: Soft. There is no tenderness. There is no rebound and no guarding.  Musculoskeletal:       Right lower leg: Normal. She exhibits no tenderness and no edema.       Left lower leg: Normal. She exhibits no tenderness and no edema.  No calf tenderness  Neurological: She is alert and oriented to person, place, and time.  Skin: Skin is warm and dry. Capillary refill takes less than 2 seconds.  No erythema.    Results for orders placed or performed during the hospital encounter of 03/07/18 (from the past 48 hour(s))  Basic metabolic panel     Status: Abnormal   Collection Time: 03/07/18  7:11 AM  Result Value Ref Range   Sodium 135 135 - 145 mmol/L   Potassium 3.7 3.5 - 5.1 mmol/L   Chloride 107 98 - 111 mmol/L   CO2 20  (L) 22 - 32 mmol/L   Glucose, Bld 94 70 - 99 mg/dL   BUN 13 6 - 20 mg/dL   Creatinine, Ser 4.09 0.44 - 1.00 mg/dL   Calcium 8.2 (L) 8.9 - 10.3 mg/dL   GFR calc non Af Amer >60 >60 mL/min   GFR calc Af Amer >60 >60 mL/min   Anion gap 8 5 - 15    Comment: Performed at [facility], [address] [place], [place] [address]  CBC     Status: Abnormal   Collection Time: 03/07/18  7:11 AM  Result Value Ref Range   WBC 6.9 4.0 - 10.5 K/uL    Comment: REPEATED TO VERIFY WHITE COUNT CONFIRMED ON SMEAR    RBC 4.08 3.87 - 5.11 MIL/uL   Hemoglobin 11.6 (L) 12.0 - 15.0 g/dL   HCT 47.8 (L) 29.5 - 62.1 %   MCV 83.1 80.0 - 100.0 fL   MCH 28.4 26.0 - 34.0 pg   MCHC 34.2 30.0 - 36.0 g/dL   RDW 30.8 65.7 - 84.6 %   Platelets 285 150 - 400 K/uL   nRBC 0.0 0.0 - 0.2 %    Comment: Performed at [facility], [address] [place], [place] [address]  Troponin I - ONCE - STAT     Status: None   Collection Time: 03/07/18  7:11 AM  Result Value Ref Range   Troponin I <0.03 <0.03 ng/mL    Comment: Performed at [facility], [address] [place], [place] [address]   Dg Chest 2 View  Result Date: 03/07/2018 CLINICAL DATA:  [name] left-sided chest pain last night, some pain radiates to the left arm, some shortness of breath, smoking history EXAM: CHEST - 2 VIEW COMPARISON:  Chest x-ray of 10/07/2015 FINDINGS: There is a left-sided hydropneumothorax present within pneumothorax approximately 15-20%. This most likely represents spontaneous pneumothorax in view of the patient's history. Mediastinal and hilar contours are unremarkable. The right lung is clear. Heart size is stable. No acute bony abnormality is seen. IMPRESSION: Probable spontaneous left hydropneumothorax of approximately 15-20%. Electronically Signed   By: Nancy Watson M.D.   On: 03/07/2018 07:57      Assessment/Plan  Left Pneumothorax Elloise Roark Watson is a 37 y.o. female with a spontaneous left pneumothorax  which is complicated by pertinent comorbidities including current tobacco abuse (smoking).    - Will plan for chest tube insertion at bedside, confirmatory CXR   - chest tube to wall suction for ~ 48 hours   - Pain control, supplemental O2   - Admit to general surgery   - Mobilize as tolerated   - DVT Prophylaxis  -- Lynden Oxford, PA-C  [facility] 03/07/2018, 10:12 AM [phone] M-F: 7am - 4pm

## 2018-03-07 NOTE — ED Notes (Signed)
Consent for chest tube placement

## 2018-03-07 NOTE — ED Provider Notes (Signed)
[facility] Emergency Department Provider Note   ____________________________________________   I have reviewed the triage vital signs and the nursing notes.   HISTORY  Chief Complaint Chest Pain   History limited by: Not Limited   HPI Nancy Watson is a 37 y.o. female who presents to the emergency department today because of concern for chest pain. The pain started yesterday evening.  The patient denies any unusual activity when the pain started.  She denies any trauma to her chest.  Since then she feels that she has had pressure to her chest.  It is worse with somewhat deep breaths.  Patient denies similar symptoms in the past.  She denies any fevers.  Has had a slight cough.  It is been nonbloody.   Per medical record review patient has a history of migraines, syncope.  Past Medical History:  Diagnosis Date  . Migraines   . Syncope     Patient Active Problem List   Diagnosis Date Noted  . Labor and delivery, indication for care 05/11/2016  . Indication for care in labor or delivery 04/27/2016  . Poor fetal growth affecting management of mother in third trimester 04/24/2016  . Abdominal pressure 03/24/2016  . Preterm labor 03/07/2016  . Abdominal pain affecting pregnancy 02/27/2016  . Pelvic pain 01/31/2016  . First trimester screening 11/18/2015  . Right ovarian cyst 10/03/2015  . Post-operative state 10/03/2015    Past Surgical History:  Procedure Laterality Date  . DILATION AND CURETTAGE OF UTERUS  09/2015  . LAPAROSCOPIC SALPINGO OOPHERECTOMY Right 10/03/2015   Procedure:  D&C,right ovarian cystectomy laparoscopic ,laparoscopic excision right ovarian mass;  Surgeon: Suzy Bouchardhomas J Schermerhorn, MD;  Location: [facility] ORS;  Service: Gynecology;  Laterality: Right;  . TUBAL LIGATION Bilateral 05/13/2016   Procedure: POST PARTUM TUBAL LIGATION;  Surgeon: Suzy Bouchardhomas J Schermerhorn, MD;  Location: [facility] ORS;  Service: Gynecology;  Laterality: Bilateral;     Prior to Admission medications   Medication Sig Start Date End Date Taking? Authorizing Provider  ferrous sulfate 325 (65 FE) MG tablet Take 1 tablet (325 mg total) by mouth 2 (two) times daily with a meal. Patient not taking: Reported on 03/07/2018 05/14/16   Karena AddisonSigmon, Meredith C, CNM    Allergies Patient has no known allergies.  No family history on file.  Social History Social History   Tobacco Use  . Smoking status: Current Every Day Smoker    Packs/day: 0.25    Types: Cigarettes  . Smokeless tobacco: Never Used  Substance Use Topics  . Alcohol use: No  . Drug use: No    Review of Systems Constitutional: No fever/chills Eyes: No visual changes. ENT: No sore throat. Cardiovascular: Positive for chest pain. Respiratory: Denies shortness of breath. Positive for cough. Gastrointestinal: No abdominal pain.  No nausea, no vomiting.  No diarrhea.   Genitourinary: Negative for dysuria. Musculoskeletal: Negative for back pain. Skin: Negative for rash. Neurological: Negative for headaches, focal weakness or numbness.  ____________________________________________   PHYSICAL EXAM:  VITAL SIGNS: ED Triage Vitals  Enc Vitals Group     BP 03/07/18 0711 116/73     Pulse Rate 03/07/18 0711 87     Resp 03/07/18 0711 16     Temp 03/07/18 0711 98 F (36.7 C)     Temp Source 03/07/18 0711 Oral     SpO2 03/07/18 0711 100 %     Weight 03/07/18 0710 160 lb (72.6 kg)     Height 03/07/18 0710 5\' 6"  (1.676  m)     Head Circumference --      Peak Flow --      Pain Score 03/07/18 0710 10   Constitutional: Alert and oriented.  Eyes: Conjunctivae are normal.  ENT      Head: Normocephalic and atraumatic.      Nose: No congestion/rhinnorhea.      Mouth/Throat: Mucous membranes are moist.      Neck: No stridor. Hematological/Lymphatic/Immunilogical: No cervical lymphadenopathy. Cardiovascular: Normal rate, regular rhythm.  No murmurs, rubs, or gallops. Respiratory: Absent breath  sounds in left upper lung.  Gastrointestinal: Soft and non tender. No rebound. No guarding.  Genitourinary: Deferred Musculoskeletal: Normal range of motion in all extremities. No lower extremity edema. Neurologic:  Normal speech and language. No gross focal neurologic deficits are appreciated.  Skin:  Skin is warm, dry and intact. No rash noted. Psychiatric: Mood and affect are normal. Speech and behavior are normal. Patient exhibits appropriate insight and judgment.  ____________________________________________    LABS (pertinent positives/negatives)  CBC wbc 6.9, hgb 11.6, plt 285  ____________________________________________   EKG  I, Phineas Semen, attending physician, personally viewed and interpreted this EKG  EKG Time: 0710 Rate: 90 Rhythm: normal sinus rhythm Axis: normal Intervals: qtc 437 QRS: narrow ST changes: no st elevation Impression: normal ekg  ____________________________________________    RADIOLOGY  CXR Pneumo/hydrothorax of left lung  I, Phineas Semen, personally viewed and evaluated these images (plain radiographs) as part of my medical decision making. ____________________________________________   PROCEDURES  Procedures  ____________________________________________   INITIAL IMPRESSION / ASSESSMENT AND PLAN / ED COURSE  Pertinent labs & imaging results that were available during my care of the patient were reviewed by me and considered in my medical decision making (see chart for details).   Patient presented to the emergency department because of concern for chest pain. CXR shows pneumothorax to the left chest. Discussed with Dr. Thelma Barge who will have surgical PA come evaluate for likely chest tube. Discussed plan with patient.   ____________________________________________   FINAL CLINICAL IMPRESSION(S) / ED DIAGNOSES  Final diagnoses:  Chest pain, unspecified type  Pneumothorax, unspecified type     Note: This dictation  was prepared with Dragon dictation. Any transcriptional errors that result from this process are unintentional     Phineas Semen, MD 03/08/18 [phone]

## 2018-03-07 NOTE — ED Notes (Signed)
Floor called, placed on hold for 5 minutes, called back, no answer when directed to nurses phone

## 2018-03-08 ENCOUNTER — Inpatient Hospital Stay: Payer: Medicaid Other

## 2018-03-08 NOTE — Progress Notes (Signed)
[facility] Progress Note     Subjective: No acute events overnight. She reports that she is still having left lateral chest wall pain, mostly with breathing. Although, she endorses that she "has her breath back." No complaints of fever, chills, abdominal pain, nausea, or emesis. She has been tolerating a diet and mobilizing to bathroom. Pulling about 1200 ccs on incentive spirometry.   Objective: Vital signs in last 24 hours: Temp:  [97.8 F (36.6 C)-98.9 F (37.2 C)] 98.9 F (37.2 C) (12/06 0537) Pulse Rate:  [67-86] 86 (12/06 0537) Resp:  [14-22] 20 (12/06 0537) BP: (115-135)/(78-107) 115/78 (12/06 0537) SpO2:  [99 %-100 %] 99 % (12/06 0537) Last BM Date: 03/08/18  Intake/Output from previous day: 12/05 0701 - 12/06 0700 In: -  Out: 720 [Urine:700; Chest Tube:20] Intake/Output this shift: No intake/output data recorded.  PE: Gen:  Alert, NAD, pleasant Chest: Chest tube in left lateral chest wall Card:  Regular rate and rhythm, pedal pulses 2+ BL Pulm:  Normal effort, clear to auscultation bilaterally Skin: warm and dry, no rashes  Psych: A&Ox3   Lab Results:  Recent Labs    03/07/18 0711  WBC 6.9  HGB 11.6*  HCT 33.9*  PLT 285   BMET Recent Labs    03/07/18 0711  NA 135  K 3.7  CL 107  CO2 20*  GLUCOSE 94  BUN 13  CREATININE 0.70  CALCIUM 8.2*   PT/INR No results for input(s): LABPROT, INR in the last 72 hours. CMP     Component Value Date/Time   NA 135 03/07/2018 0711   NA 143 01/20/2014 1034   K 3.7 03/07/2018 0711   K 3.6 01/20/2014 1034   CL 107 03/07/2018 0711   CL 112 (H) 01/20/2014 1034   CO2 20 (L) 03/07/2018 0711   CO2 22 01/20/2014 1034   GLUCOSE 94 03/07/2018 0711   GLUCOSE 128 (H) 01/20/2014 1034   BUN 13 03/07/2018 0711   BUN 13 01/20/2014 1034   CREATININE 0.70 03/07/2018 0711   CREATININE 0.91 01/20/2014 1034   CALCIUM 8.2 (L) 03/07/2018 0711   CALCIUM 8.3 (L) 01/20/2014 1034   PROT 6.7 12/07/2015 1026   PROT 7.8 01/20/2014 1034   ALBUMIN 3.6 12/07/2015 1026   ALBUMIN 3.9 01/20/2014 1034   AST 17 12/07/2015 1026   AST 19 01/20/2014 1034   ALT 9 (L) 12/07/2015 1026   ALT 15 01/20/2014 1034   ALKPHOS 41 12/07/2015 1026   ALKPHOS 51 01/20/2014 1034   BILITOT 0.4 12/07/2015 1026   BILITOT 0.4 01/20/2014 1034   GFRNONAA >60 03/07/2018 0711   GFRNONAA >60 01/20/2014 1034   GFRNONAA >60 05/02/2012 1806   GFRAA >60 03/07/2018 0711   GFRAA >60 01/20/2014 1034   GFRAA >60 05/02/2012 1806   Lipase     Component Value Date/Time   LIPASE 25 11/29/2015 1144       Studies/Results: Dg Chest 2 View  Result Date: 03/07/2018 CLINICAL DATA:  Lambert ModySharp left-sided chest pain last night, some pain radiates to the left arm, some shortness of breath, smoking history EXAM: CHEST - 2 VIEW COMPARISON:  Chest x-ray of 10/07/2015 FINDINGS: There is a left-sided hydropneumothorax present within pneumothorax approximately 15-20%. This most likely represents spontaneous pneumothorax in view of the patient's history. Mediastinal and hilar contours are unremarkable. The right lung is clear. Heart size is stable. No acute bony abnormality is seen. IMPRESSION: Probable spontaneous left hydropneumothorax of approximately 15-20%. Electronically Signed   By: Renae FicklePaul  Gery Pray M.D.   On: 03/07/2018 07:57   Dg Chest Port 1 View  Result Date: 03/08/2018 CLINICAL DATA:  Follow-up left pneumothorax EXAM: PORTABLE CHEST 1 VIEW COMPARISON:  03/07/2018 FINDINGS: Cardiac shadow is stable. Pigtail catheter is again noted on the left. No definitive pneumothorax is noted. Mild bibasilar atelectasis is seen slightly increased on the right when compared with the prior exam. No bony abnormality is noted. IMPRESSION: No definitive pneumothorax is noted.  Chest tube remains in place. Bibasilar atelectatic changes which have increased in the interval from the prior exam. Electronically Signed   By: Alcide Clever M.D.   On: 03/08/2018 06:37   Dg  Chest Port 1 View  Result Date: 03/07/2018 CLINICAL DATA:  Chest tube placement. EXAM: PORTABLE CHEST 1 VIEW COMPARISON:  Radiographs of same day. FINDINGS: The heart size and mediastinal contours are within normal limits. Right lung is clear. Interval placement of left-sided chest tube. No pneumothorax is seen currently. Mild left basilar atelectasis is noted. The visualized skeletal structures are unremarkable. IMPRESSION: No pneumothorax seen status post left-sided chest tube placement. Mild left basilar atelectasis is noted. Electronically Signed   By: Lupita Raider, M.D.   On: 03/07/2018 11:57    Anti-infectives: Anti-infectives (From admission, onward)   None       Assessment/Plan  Left Pneumothorax Nancy Watson is a 37 y.o. female with a currently resolved spontaneous left pneumothorax which is complicated by pertinent comorbidities including current tobacco abuse (smoking).    - No air leak this morning, will place to water seal.   - CXR in the morning, if PTX remains resolved and is without air leak will remove   - Pain control                         - Mobilize as tolerated                         - DVT Prophylaxis  -- Lynden Oxford , PA-C [facility] 03/08/2018, 7:58 AM [phone] M-F: 7am - 4pm

## 2018-03-09 ENCOUNTER — Inpatient Hospital Stay: Payer: Medicaid Other

## 2018-03-09 LAB — HIV ANTIBODY (ROUTINE TESTING W REFLEX): HIV Screen 4th Generation wRfx: NONREACTIVE

## 2018-03-09 MED ORDER — IBUPROFEN 800 MG PO TABS: 800.0000 mg | ORAL_TABLET | Freq: Three times a day (TID) | ORAL | 0 refills | Status: DC | PRN

## 2018-03-09 MED ORDER — ACETAMINOPHEN 325 MG PO TABS: 650.0000 mg | ORAL_TABLET | Freq: Three times a day (TID) | ORAL | 0 refills | Status: AC | PRN

## 2018-03-09 NOTE — Progress Notes (Signed)
Chest tube remove by MD.

## 2018-03-09 NOTE — Progress Notes (Signed)
Nancy Watson  A and O x 4. VSS. Pt tolerating diet well. No complaints of pain or nausea. IV removed intact, prescriptions given. Pt voiced understanding of discharge instructions with no further questions. Pt discharged via wheelchair with RN.   Allergies as of 03/09/2018   No Known Allergies     Medication List    STOP taking these medications   ferrous sulfate 325 (65 FE) MG tablet     TAKE these medications   acetaminophen 325 MG tablet Commonly known as:  TYLENOL Take 2 tablets (650 mg total) by mouth every 8 (eight) hours as needed for mild pain.   ibuprofen 800 MG tablet Commonly known as:  ADVIL,MOTRIN Take 1 tablet (800 mg total) by mouth every 8 (eight) hours as needed for mild pain or moderate pain.       Vitals:   03/09/18 0507 03/09/18 1148  BP: 123/86 128/87  Pulse: 72 85  Resp: 16 18  Temp: 98.2 F (36.8 C) 98.2 F (36.8 C)  SpO2: 100% 100%    Suzzanne CloudJuan G Rodriguez Ornelas

## 2018-03-09 NOTE — Discharge Summary (Signed)
Physician Discharge Summary  Patient ID: Nancy Watson MRN: [id number] DOB/AGE: 36/04/1980 36 y.o.  Admit date: 03/07/2018 Discharge date: 03/09/2018  Admission Diagnoses: left pneumothorax, spontaneous  Discharge Diagnoses:  Same as above  Discharged Condition: good  Hospital Course: admitted for pneumothorax, treated with chest tube.  Pneumothorax resolved without any issues, and chest tube pulled.  Confirmed no recurrence, pain controlled, and discharged with followup in clinic for wound check.  Consults: None  Discharge Exam: Blood pressure 128/87, pulse 85, temperature 98.2 F (36.8 C), temperature source Oral, resp. rate 18, height 5\' 6"  (1.676 m), weight 72.6 kg, last menstrual period 02/09/2018, SpO2 100 %, not currently breastfeeding. General appearance: alert, cooperative and no distress Skin: chest tube site clean.  suture removed from tube and pulled at bedside without any issues.  Covered with dressing with suture in skin still intact to keep rest of incision closed.  Disposition:  Discharge disposition: 01-Home or Self Care       Discharge Instructions    Discharge patient   Complete by:  As directed    Discharge disposition:  01-Home or Self Care   Discharge patient date:  03/09/2018     Allergies as of 03/09/2018   No Known Allergies     Medication List    STOP taking these medications   ferrous sulfate 325 (65 FE) MG tablet     TAKE these medications   acetaminophen 325 MG tablet Commonly known as:  TYLENOL Take 2 tablets (650 mg total) by mouth every 8 (eight) hours as needed for mild pain.   ibuprofen 800 MG tablet Commonly known as:  ADVIL,MOTRIN Take 1 tablet (800 mg total) by mouth every 8 (eight) hours as needed for mild pain or moderate pain.      Follow-up Information    Pabon, HawaiiDiego F, MD Follow up in 1 week(s).   Specialty:  General Surgery Why:  for wound check Contact information: [address] [place] [id number] [phone]            Total time spent arranging discharge was >2530min. Signed: Sung Amabilesami Davona Kinoshita 03/09/2018, 12:58 PM

## 2018-03-09 NOTE — Discharge Instructions (Signed)
-  Keep dressing intact for 48hrs, then ok to remove and shower.  Change dressing every day as needed until followup in office for wound check.  -tylenol and advil as needed for discomfort.  Please alternate between the two every four hours as needed for pain.   -325-650mg  every 8hrs to max of 4000mg /24hrs (including the 325mg  in every norco dose) for the tylenol.   -Advil up to 800mg  per dose every 8hrs as needed for pain.

## 2018-03-15 ENCOUNTER — Telehealth: Payer: Self-pay | Admitting: Licensed Clinical Social Worker

## 2018-03-15 NOTE — Telephone Encounter (Signed)
CSW left a HIPPA compliant voice message for patient to return CSW call. CSW attempting to reach patient in response to an EMMI call response patient gave. York SpanielMonica Alexandr Oehler MSW,LCSW [phone]

## 2018-03-16 ENCOUNTER — Telehealth: Payer: Self-pay | Admitting: Licensed Clinical Social Worker

## 2018-03-16 NOTE — Telephone Encounter (Signed)
The patient did not answer. The CSW left a HIPPA compliant voice mail.

## 2018-03-18 ENCOUNTER — Encounter: Payer: Self-pay | Admitting: Surgery

## 2018-03-18 ENCOUNTER — Ambulatory Visit (INDEPENDENT_AMBULATORY_CARE_PROVIDER_SITE_OTHER): Payer: Medicaid Other | Admitting: Surgery

## 2018-03-18 ENCOUNTER — Other Ambulatory Visit: Payer: Self-pay

## 2018-03-18 VITALS — BP 122/81 | HR 89 | Temp 97.8°F | Resp 12 | Ht 66.0 in | Wt 157.0 lb

## 2018-03-18 DIAGNOSIS — J9383 Other pneumothorax: Secondary | ICD-10-CM

## 2018-03-18 MED ORDER — GABAPENTIN 300 MG PO CAPS: 300.0000 mg | ORAL_CAPSULE | Freq: Three times a day (TID) | ORAL | 1 refills | Status: DC

## 2018-03-18 NOTE — Patient Instructions (Signed)
Rx sent to [facility]. Return as needed.The patient is aware to call back for any questions or concerns.

## 2018-03-18 NOTE — Progress Notes (Signed)
Outpatient Surgical Follow Up  03/18/2018  Gardner Candleeniesh S Wiesen is an 37 y.o. female.   Chief Complaint  Patient presents with  . Follow-up    HPI: [id number] smoker female f/u spont PTX. S/p CT placement. Doing well, some intermittent sharp left-sided chest pain.  Denies any shortness of breath no chest pain.  No fevers no chills no weight loss. We will to perform more than 4 METS of activity without any shortness of breath.  She still smokes about 5 cigarettes a day.  Past Medical History:  Diagnosis Date  . Migraines   . Syncope     Past Surgical History:  Procedure Laterality Date  . DILATION AND CURETTAGE OF UTERUS  09/2015  . LAPAROSCOPIC SALPINGO OOPHERECTOMY Right 10/03/2015   Procedure:  D&C,right ovarian cystectomy laparoscopic ,laparoscopic excision right ovarian mass;  Surgeon: Suzy Bouchardhomas J Schermerhorn, MD;  Location: [facility] ORS;  Service: Gynecology;  Laterality: Right;  . TUBAL LIGATION Bilateral 05/13/2016   Procedure: POST PARTUM TUBAL LIGATION;  Surgeon: Suzy Bouchardhomas J Schermerhorn, MD;  Location: [facility] ORS;  Service: Gynecology;  Laterality: Bilateral;    History reviewed. No pertinent family history.  Social History:  reports that she has been smoking cigarettes. She has been smoking about 0.25 packs per day. She has never used smokeless tobacco. She reports that she does not drink alcohol or use drugs.  Allergies: No Known Allergies  Medications reviewed.    ROS Full ROS performed and is otherwise negative other than what is stated in HPI   BP 122/81   Pulse 89   Temp 97.8 F (36.6 C) (Skin)   Resp 12   Ht 5\' 6"  (1.676 m)   Wt 157 lb (71.2 kg)   LMP 03/11/2018   SpO2 98%   BMI 25.34 kg/m   Physical Exam Vitals signs and nursing note reviewed.  Constitutional:      Appearance: Normal appearance. She is normal weight. She is not ill-appearing.  Eyes:     General: No scleral icterus.       Right eye: No discharge.        Left eye: No discharge.  Neck:   Musculoskeletal: Normal range of motion and neck supple. No neck rigidity or muscular tenderness.     Vascular: No carotid bruit.  Cardiovascular:     Rate and Rhythm: Normal rate and regular rhythm.     Heart sounds: No murmur. No gallop.   Pulmonary:     Effort: Pulmonary effort is normal. No respiratory distress.     Breath sounds: No stridor. No wheezing.     Comments: Chest tube site healing well, no infection. No crepitus Abdominal:     General: Abdomen is flat. There is no distension.     Palpations: There is no mass.  Musculoskeletal: Normal range of motion.        General: No swelling.  Skin:    General: Skin is warm and dry.     Coloration: Skin is not jaundiced.  Neurological:     General: No focal deficit present.     Mental Status: She is alert and oriented to person, place, and time.     Cranial Nerves: No cranial nerve deficit.  Psychiatric:        Mood and Affect: Mood normal.        Behavior: Behavior normal.        Thought Content: Thought content normal.        Judgment: Judgment normal.     Assessment/Plan:  Left PTX doing well , no clinical evidence of recurrence Urged smoking cessation and avoid any more spheric pressure changes such as diving or flying. I will prescribe gabapentin since I ideally do not want to try narcotics at this time.  Also discussed with her Tylenol and NSAIDs as adjuvant therapy for pain. RTC prn      Sterling Big, MD [facility] General Surgeon

## 2018-04-09 ENCOUNTER — Telehealth: Payer: Self-pay | Admitting: *Deleted

## 2018-04-09 NOTE — Telephone Encounter (Signed)
Patient called and stated that she had surgery by Dr.Pabon about a month ago and she is complaning of some pain at the incision site where the tube was placed. Patient feels the stitch is still in place and is bothering her. Please call and advise

## 2018-04-10 NOTE — Telephone Encounter (Signed)
Pt needs an appointment for evaluation.

## 2018-04-15 ENCOUNTER — Ambulatory Visit: Payer: Medicaid Other | Admitting: Surgery

## 2019-02-25 ENCOUNTER — Other Ambulatory Visit: Payer: Self-pay

## 2019-02-25 DIAGNOSIS — Z20822 Contact with and (suspected) exposure to covid-19: Secondary | ICD-10-CM

## 2019-02-26 ENCOUNTER — Telehealth: Payer: Self-pay | Admitting: *Deleted

## 2019-02-26 LAB — NOVEL CORONAVIRUS, NAA: SARS-CoV-2, NAA: DETECTED — AB

## 2019-02-26 NOTE — Telephone Encounter (Signed)
Patient called for results ,still pending advised to call back. 

## 2019-03-25 ENCOUNTER — Emergency Department
Admission: EM | Admit: 2019-03-25 | Discharge: 2019-03-25 | Disposition: A | Discharge status: Home / Self Care | Payer: Medicaid Other | Attending: Emergency Medicine | Admitting: Emergency Medicine

## 2019-03-25 ENCOUNTER — Encounter: Payer: Self-pay | Admitting: Emergency Medicine

## 2019-03-25 ENCOUNTER — Other Ambulatory Visit: Payer: Self-pay

## 2019-03-25 DIAGNOSIS — Z5321 Procedure and treatment not carried out due to patient leaving prior to being seen by health care provider: Secondary | ICD-10-CM | POA: Insufficient documentation

## 2019-03-25 DIAGNOSIS — M25519 Pain in unspecified shoulder: Secondary | ICD-10-CM | POA: Diagnosis present

## 2019-03-25 NOTE — ED Notes (Signed)
Patient called, no answer. Patient not seen in lobby, bathroom, or outside.  

## 2019-03-25 NOTE — ED Notes (Signed)
Patient called, no answer, not seen in lobby °

## 2019-03-25 NOTE — ED Notes (Signed)
Patient seen leaving lobby by registration staff.

## 2019-03-25 NOTE — ED Triage Notes (Addendum)
Patient ambulatory to triage with steady gait, without difficulty or distress noted, mask in place; pt reports awoke this am with left shoulder pain; st pain nonradiating and increases with any type of ROM; denies any accomp symptoms

## 2019-07-07 ENCOUNTER — Emergency Department: Payer: Medicaid Other

## 2019-07-07 ENCOUNTER — Emergency Department
Admission: EM | Admit: 2019-07-07 | Discharge: 2019-07-07 | Disposition: A | Discharge status: Home / Self Care | Payer: Medicaid Other | Attending: Emergency Medicine | Admitting: Emergency Medicine

## 2019-07-07 ENCOUNTER — Other Ambulatory Visit: Payer: Self-pay

## 2019-07-07 DIAGNOSIS — Y999 Unspecified external cause status: Secondary | ICD-10-CM | POA: Diagnosis not present

## 2019-07-07 DIAGNOSIS — S61213A Laceration without foreign body of left middle finger without damage to nail, initial encounter: Secondary | ICD-10-CM | POA: Diagnosis not present

## 2019-07-07 DIAGNOSIS — W260XXA Contact with knife, initial encounter: Secondary | ICD-10-CM | POA: Insufficient documentation

## 2019-07-07 DIAGNOSIS — Y929 Unspecified place or not applicable: Secondary | ICD-10-CM | POA: Diagnosis not present

## 2019-07-07 DIAGNOSIS — F17210 Nicotine dependence, cigarettes, uncomplicated: Secondary | ICD-10-CM | POA: Diagnosis not present

## 2019-07-07 DIAGNOSIS — Y93G1 Activity, food preparation and clean up: Secondary | ICD-10-CM | POA: Insufficient documentation

## 2019-07-07 DIAGNOSIS — Z79899 Other long term (current) drug therapy: Secondary | ICD-10-CM | POA: Diagnosis not present

## 2019-07-07 MED ORDER — CEPHALEXIN 500 MG PO CAPS: 500.0000 mg | ORAL_CAPSULE | Freq: Three times a day (TID) | ORAL | 0 refills | Status: DC

## 2019-07-07 NOTE — Discharge Instructions (Addendum)
Follow-up with your primary care provider if any continued problems.  Keep area clean and dry.  Allow Steri-Strips to fall off on their own.  Begin taking Keflex 500 mg 3 times daily for the next 5 days for infection prevention.  You may take Tylenol or ibuprofen as needed for pain.

## 2019-07-07 NOTE — ED Triage Notes (Signed)
Pt comes via pOV from home with c/o left middle finger injury the patient states she cut herself by accident with a knife. Pt has finger wrapped and bleeding controlled at this time

## 2019-07-07 NOTE — ED Provider Notes (Signed)
[facility] Emergency Department Provider Note   ____________________________________________   First MD Initiated Contact with Patient 07/07/19 1304     (approximate)  I have reviewed the triage vital signs and the nursing notes.   HISTORY  Chief Complaint Laceration   HPI Nancy Watson is a 39 y.o. female presents to the ED with complaint of laceration to her left middle finger at approximately 11 PM yesterday.  Patient states that she has had a tetanus within the last 5 years.  Patient was cutting ham when she accidentally cut her finger.  Bleeding has been controlled.  She rates her pain as a 5/10.       Past Medical History:  Diagnosis Date  . Migraines   . Syncope     Patient Active Problem List   Diagnosis Date Noted  . Pneumothorax on left 03/07/2018  . Labor and delivery, indication for care 05/11/2016  . Indication for care in labor or delivery 04/27/2016  . Poor fetal growth affecting management of mother in third trimester 04/24/2016  . Abdominal pressure 03/24/2016  . Preterm labor 03/07/2016  . Abdominal pain affecting pregnancy 02/27/2016  . Pelvic pain 01/31/2016  . First trimester screening 11/18/2015  . Right ovarian cyst 10/03/2015  . Post-operative state 10/03/2015    Past Surgical History:  Procedure Laterality Date  . DILATION AND CURETTAGE OF UTERUS  09/2015  . LAPAROSCOPIC SALPINGO OOPHERECTOMY Right 10/03/2015   Procedure:  D&C,right ovarian cystectomy laparoscopic ,laparoscopic excision right ovarian mass;  Surgeon: Boykin Nearing, MD;  Location: [facility] ORS;  Service: Gynecology;  Laterality: Right;  . TUBAL LIGATION Bilateral 05/13/2016   Procedure: POST PARTUM TUBAL LIGATION;  Surgeon: Boykin Nearing, MD;  Location: [facility] ORS;  Service: Gynecology;  Laterality: Bilateral;    Prior to Admission medications   Medication Sig Start Date End Date Taking? Authorizing Provider  cephALEXin (KEFLEX) 500 MG  capsule Take 1 capsule (500 mg total) by mouth 3 (three) times daily. 07/07/19   Johnn Hai, PA-C  gabapentin (NEURONTIN) 300 MG capsule Take 1 capsule (300 mg total) by mouth 3 (three) times daily. 03/18/18   Jules Husbands, MD    Allergies Patient has no known allergies.  No family history on file.  Social History Social History   Tobacco Use  . Smoking status: Current Every Day Smoker    Packs/day: 0.25    Types: Cigarettes  . Smokeless tobacco: Never Used  Substance Use Topics  . Alcohol use: No  . Drug use: No    Review of Systems Constitutional: No fever/chills Cardiovascular: Denies chest pain. Respiratory: Denies shortness of breath. Gastrointestinal:  No nausea, no vomiting. Musculoskeletal: Left third finger pain. Skin: Laceration left third finger. Neurological: Negative for headaches, focal weakness or numbness. ____________________________________________   PHYSICAL EXAM:  VITAL SIGNS: ED Triage Vitals  Enc Vitals Group     BP 07/07/19 1215 112/68     Pulse Rate 07/07/19 1215 79     Resp 07/07/19 1215 18     Temp 07/07/19 1215 98.2 F (36.8 C)     Temp src --      SpO2 07/07/19 1215 100 %     Weight 07/07/19 1216 140 lb (63.5 kg)     Height 07/07/19 1216 5\' 4"  (1.626 m)     Head Circumference --      Peak Flow --      Pain Score 07/07/19 1216 5     Pain Loc --  Pain Edu? --      Excl. in GC? --     Constitutional: Alert and oriented. Well appearing and in no acute distress. Eyes: Conjunctivae are normal. PERRL. EOMI. Head: Atraumatic. Neck: No stridor.   Cardiovascular: Normal rate, regular rhythm. Grossly normal heart sounds.  Good peripheral circulation. Respiratory: Normal respiratory effort.  No retractions. Lungs CTAB. Musculoskeletal: Range of motion of the left third digit is slightly restricted secondary to pain from her laceration but patient is able to flex and extend the distal phalanx.  Motor sensory function intact.  Neurologic:  Normal speech and language. No gross focal neurologic deficits are appreciated. No gait instability. Skin:  Skin is warm, dry.  1 cm superficial laceration noted to the dorsal aspect of the left third digit distal phalanx.  No active bleeding.  No foreign body noted.  Wound was not involved.  Area is markedly tender to light palpation. Psychiatric: Mood and affect are normal. Speech and behavior are normal.  ____________________________________________   LABS (all labs ordered are listed, but only abnormal results are displayed)  Labs Reviewed - No data to display ____________________________________________ RADIOLOGY   Official radiology report(s): DG Finger Middle Left  Result Date: 07/07/2019 CLINICAL DATA:  Laceration of the middle finger EXAM: LEFT MIDDLE FINGER 2+V COMPARISON:  None. FINDINGS: There is no evidence of fracture or dislocation. There is no evidence of arthropathy or other focal bone abnormality. Soft tissues are unremarkable. IMPRESSION: Negative. Electronically Signed   By: Elige Ko   On: 07/07/2019 13:38    ____________________________________________   PROCEDURES  Procedure(s) performed (including Critical Care):  Procedures   ____________________________________________   INITIAL IMPRESSION / ASSESSMENT AND PLAN / ED COURSE  As part of my medical decision making, I reviewed the following data within the electronic MEDICAL RECORD NUMBER Notes from prior ED visits and [place] Controlled Substance Database  39 year old female presents to the ED with laceration that is approximately 14 hours old.  Patient states that she cut herself on a kitchen knife last evening.  Patient tetanus is up-to-date.  No active bleeding and laceration appears to be superficial.  Patient is markedly tender to light palpation.  Range of motion is slightly restricted secondary to discomfort of her injury.  Area was Steri-Stripped.  Patient was given a prescription for Keflex  for infection prophylaxis.  She has follow-up with her PCP if any continued problems or return to the emergency department if any severe  worsening or urgent concerns.  ____________________________________________   FINAL CLINICAL IMPRESSION(S) / ED DIAGNOSES  Final diagnoses:  Laceration of left middle finger without foreign body without damage to nail, initial encounter     ED Discharge Orders         Ordered    cephALEXin (KEFLEX) 500 MG capsule  3 times daily     07/07/19 1404           Note:  This document was prepared using Dragon voice recognition software and may include unintentional dictation errors.    Tommi Rumps, PA-C 07/07/19 1412    Chesley Noon, MD 07/08/19 [phone]

## 2019-07-07 NOTE — ED Notes (Signed)
See triage note  States she cut her left middle finger around 11 pm last night

## 2019-09-26 ENCOUNTER — Other Ambulatory Visit: Payer: Self-pay

## 2019-09-26 ENCOUNTER — Emergency Department
Admission: EM | Admit: 2019-09-26 | Discharge: 2019-09-26 | Disposition: A | Discharge status: Home / Self Care | Payer: Medicaid Other | Attending: Emergency Medicine | Admitting: Emergency Medicine

## 2019-09-26 DIAGNOSIS — T63461A Toxic effect of venom of wasps, accidental (unintentional), initial encounter: Secondary | ICD-10-CM | POA: Diagnosis not present

## 2019-09-26 DIAGNOSIS — F17210 Nicotine dependence, cigarettes, uncomplicated: Secondary | ICD-10-CM | POA: Insufficient documentation

## 2019-09-26 MED ORDER — PREDNISONE 10 MG (21) PO TBPK: ORAL_TABLET | ORAL | 0 refills | Status: DC

## 2019-09-26 MED ORDER — DIPHENHYDRAMINE HCL 25 MG PO CAPS
25.0000 mg | ORAL_CAPSULE | Freq: Once | ORAL | Status: AC
  Administered 2019-09-26: 25 mg via ORAL
  Filled 2019-09-26: qty 1

## 2019-09-26 MED ORDER — DEXAMETHASONE SODIUM PHOSPHATE 10 MG/ML IJ SOLN
10.0000 mg | Freq: Once | INTRAMUSCULAR | Status: AC
  Administered 2019-09-26: 10 mg via INTRAMUSCULAR
  Filled 2019-09-26: qty 1

## 2019-09-26 NOTE — ED Notes (Signed)
Triage performed by Eileen Stanford RN and not Alissa NT

## 2019-09-26 NOTE — ED Provider Notes (Signed)
[facility] Emergency Department Provider Note  ____________________________________________   First MD Initiated Contact with Patient 09/26/19 2242     (approximate)  I have reviewed the triage vital signs and the nursing notes.   HISTORY  Chief Complaint No chief complaint on file.    HPI Nancy Watson is a 39 y.o. female presents emergency department complaint of multiple wasp stings.  Happened prior to arrival while she was at home.  No throat swelling or respiratory complaints.  States areas sting and burn.  She did not take any Benadryl.   Pain is rated at 10/10   Past Medical History:  Diagnosis Date  . Migraines   . Syncope     Patient Active Problem List   Diagnosis Date Noted  . Pneumothorax on left 03/07/2018  . Labor and delivery, indication for care 05/11/2016  . Indication for care in labor or delivery 04/27/2016  . Poor fetal growth affecting management of mother in third trimester 04/24/2016  . Abdominal pressure 03/24/2016  . Preterm labor 03/07/2016  . Abdominal pain affecting pregnancy 02/27/2016  . Pelvic pain 01/31/2016  . First trimester screening 11/18/2015  . Right ovarian cyst 10/03/2015  . Post-operative state 10/03/2015    Past Surgical History:  Procedure Laterality Date  . DILATION AND CURETTAGE OF UTERUS  09/2015  . LAPAROSCOPIC SALPINGO OOPHERECTOMY Right 10/03/2015   Procedure:  D&C,right ovarian cystectomy laparoscopic ,laparoscopic excision right ovarian mass;  Surgeon: Suzy Bouchard, MD;  Location: [facility] ORS;  Service: Gynecology;  Laterality: Right;  . TUBAL LIGATION Bilateral 05/13/2016   Procedure: POST PARTUM TUBAL LIGATION;  Surgeon: Suzy Bouchard, MD;  Location: [facility] ORS;  Service: Gynecology;  Laterality: Bilateral;    Prior to Admission medications   Medication Sig Start Date End Date Taking? Authorizing Provider  cephALEXin (KEFLEX) 500 MG capsule Take 1 capsule (500 mg total) by  mouth 3 (three) times daily. 07/07/19   Tommi Rumps, PA-C  gabapentin (NEURONTIN) 300 MG capsule Take 1 capsule (300 mg total) by mouth 3 (three) times daily. 03/18/18   Pabon, Diego F, MD  predniSONE (STERAPRED UNI-PAK 21 TAB) 10 MG (21) TBPK tablet Take 6 pills on day one then decrease by 1 pill each day 09/26/19   Faythe Ghee, PA-C    Allergies Patient has no known allergies.  No family history on file.  Social History Social History   Tobacco Use  . Smoking status: Current Every Day Smoker    Packs/day: 0.25    Types: Cigarettes  . Smokeless tobacco: Never Used  Vaping Use  . Vaping Use: Never used  Substance Use Topics  . Alcohol use: No  . Drug use: No    Review of Systems  Constitutional: No fever/chills Eyes: No visual changes. ENT: No sore throat. Respiratory: Denies cough Cardiovascular: Denies chest pain Gastrointestinal: Denies abdominal pain Genitourinary: Negative for dysuria. Musculoskeletal: Negative for back pain. Skin: Negative for rash.  Positive wasp stings Psychiatric: no mood changes,     ____________________________________________   PHYSICAL EXAM:  VITAL SIGNS: ED Triage Vitals  Enc Vitals Group     BP 09/26/19 2239 121/80     Pulse Rate 09/26/19 2239 79     Resp 09/26/19 2239 15     Temp 09/26/19 2239 99.1 F (37.3 C)     Temp src --      SpO2 09/26/19 2239 100 %     Weight 09/26/19 2237 140 lb (63.5 kg)  Height 09/26/19 2237 5\' 4"  (1.626 m)     Head Circumference --      Peak Flow --      Pain Score 09/26/19 2237 10     Pain Loc --      Pain Edu? --      Excl. in GC? --     Constitutional: Alert and oriented. Well appearing and in no acute distress. Eyes: Conjunctivae are normal.  Head: Atraumatic. Nose: No congestion/rhinnorhea. Mouth/Throat: Mucous membranes are moist.   Neck:  supple no lymphadenopathy noted Cardiovascular: Normal rate, regular rhythm. Heart sounds are normal Respiratory: Normal respiratory  effort.  No retractions, lungs c t a  GU: deferred Musculoskeletal: FROM all extremities, warm and well perfused Neurologic:  Normal speech and language.  Skin:  Skin is warm, dry and intact. No rash noted.  Red swollen areas noted on the lower leg, buttock, right hand from insect stings Psychiatric: Mood and affect are normal. Speech and behavior are normal.  ____________________________________________   LABS (all labs ordered are listed, but only abnormal results are displayed)  Labs Reviewed - No data to display ____________________________________________   ____________________________________________  RADIOLOGY    ____________________________________________   PROCEDURES  Procedure(s) performed: Benadryl 25 mg p.o., Decadron 10 mg IM   Procedures    ____________________________________________   INITIAL IMPRESSION / ASSESSMENT AND PLAN / ED COURSE  Pertinent labs & imaging results that were available during my care of the patient were reviewed by me and considered in my medical decision making (see chart for details).   The patient is a 39 year old female presents emergency department with multiple wasp stings.  See HPI  Physical exam shows multiple red swollen areas on extremities typical of a insect sting.  Remainder the exam is unremarkable  Did explain the findings to the patient.  She was given Benadryl and a Decadron injection.  She is to follow-up with her regular doctor if not improving.  She is continue to take Benadryl to help decrease the redness and swelling.  She is also apply ice.  Sterapred if needed tomorrow.  She is discharged in stable condition    Nancy Watson was evaluated in Emergency Department on 09/26/2019 for the symptoms described in the history of present illness. She was evaluated in the context of the global COVID-19 pandemic, which necessitated consideration that the patient might be at risk for infection with the SARS-CoV-2 virus  that causes COVID-19. Institutional protocols and algorithms that pertain to the evaluation of patients at risk for COVID-19 are in a state of rapid change based on information released by regulatory bodies including the CDC and federal and state organizations. These policies and algorithms were followed during the patient's care in the ED.   As part of my medical decision making, I reviewed the following data within the electronic MEDICAL RECORD NUMBER Nursing notes reviewed and incorporated, Old chart reviewed, Notes from prior ED visits and [place] Controlled Substance Database  ____________________________________________   FINAL CLINICAL IMPRESSION(S) / ED DIAGNOSES  Final diagnoses:  Wasp sting, accidental or unintentional, initial encounter      NEW MEDICATIONS STARTED DURING THIS VISIT:  New Prescriptions   PREDNISONE (STERAPRED UNI-PAK 21 TAB) 10 MG (21) TBPK TABLET    Take 6 pills on day one then decrease by 1 pill each day     Note:  This document was prepared using Dragon voice recognition software and may include unintentional dictation errors.    09/28/2019, PA-C 09/26/19  Elliston, Pierz, MD 09/26/19 [phone]

## 2019-09-26 NOTE — ED Triage Notes (Signed)
Patient stuck right leg, buttocks, arms, hand. Patient c/o pain to sites. Patient denies respiratory complaints. Patient's respirations even and unlabored.

## 2019-09-26 NOTE — Discharge Instructions (Addendum)
Follow-up with your regular doctor if not improving in 3 to 4 days.  Take over-the-counter Benadryl.  You may start a steroid pack if still have red swollen areas tomorrow.  Apply ice to the areas to decrease the swelling.

## 2019-09-26 NOTE — ED Notes (Signed)
Report to chrissy, rn,.

## 2020-01-13 ENCOUNTER — Emergency Department: Payer: Medicaid Other

## 2020-01-13 ENCOUNTER — Emergency Department
Admission: EM | Admit: 2020-01-13 | Discharge: 2020-01-13 | Disposition: A | Discharge status: Home / Self Care | Payer: Medicaid Other | Attending: Emergency Medicine | Admitting: Emergency Medicine

## 2020-01-13 ENCOUNTER — Other Ambulatory Visit: Payer: Self-pay

## 2020-01-13 DIAGNOSIS — R200 Anesthesia of skin: Secondary | ICD-10-CM | POA: Diagnosis not present

## 2020-01-13 DIAGNOSIS — Z5321 Procedure and treatment not carried out due to patient leaving prior to being seen by health care provider: Secondary | ICD-10-CM | POA: Insufficient documentation

## 2020-01-13 DIAGNOSIS — H5712 Ocular pain, left eye: Secondary | ICD-10-CM | POA: Diagnosis not present

## 2020-01-13 LAB — POCT PREGNANCY, URINE: Preg Test, Ur: NEGATIVE

## 2020-01-13 LAB — COMPREHENSIVE METABOLIC PANEL
ALT: 11 U/L (ref 0–44)
AST: 16 U/L (ref 15–41)
Albumin: 4.5 g/dL (ref 3.5–5.0)
Alkaline Phosphatase: 49 U/L (ref 38–126)
Anion gap: 11 (ref 5–15)
BUN: 8 mg/dL (ref 6–20)
CO2: 21 mmol/L — ABNORMAL LOW (ref 22–32)
Calcium: 8.8 mg/dL — ABNORMAL LOW (ref 8.9–10.3)
Chloride: 106 mmol/L (ref 98–111)
Creatinine, Ser: 0.8 mg/dL (ref 0.44–1.00)
GFR, Estimated: >60 mL/min (ref >60)
Glucose, Bld: 96 mg/dL (ref 70–99)
Potassium: 3.7 mmol/L (ref 3.5–5.1)
Sodium: 138 mmol/L (ref 135–145)
Total Bilirubin: 0.6 mg/dL (ref 0.3–1.2)
Total Protein: 8 g/dL (ref 6.5–8.1)

## 2020-01-13 LAB — DIFFERENTIAL
Abs Immature Granulocytes: 0 10*3/uL (ref 0.00–0.07)
Basophils Absolute: 0.1 10*3/uL (ref 0.0–0.1)
Basophils Relative: 2 %
Eosinophils Absolute: 0.2 10*3/uL (ref 0.0–0.5)
Eosinophils Relative: 4 %
Immature Granulocytes: 0 %
Lymphocytes Relative: 51 %
Lymphs Abs: 2 10*3/uL (ref 0.7–4.0)
Monocytes Absolute: 0.3 10*3/uL (ref 0.1–1.0)
Monocytes Relative: 7 %
Neutro Abs: 1.4 10*3/uL — ABNORMAL LOW (ref 1.7–7.7)
Neutrophils Relative %: 36 %

## 2020-01-13 LAB — CBC
HCT: 35.9 % — ABNORMAL LOW (ref 36.0–46.0)
Hemoglobin: 11.9 g/dL — ABNORMAL LOW (ref 12.0–15.0)
MCH: 28.1 pg (ref 26.0–34.0)
MCHC: 33.1 g/dL (ref 30.0–36.0)
MCV: 84.7 fL (ref 80.0–100.0)
Platelets: 321 10*3/uL (ref 150–400)
RBC: 4.24 MIL/uL (ref 3.87–5.11)
RDW: 14.2 % (ref 11.5–15.5)
WBC: 4 10*3/uL (ref 4.0–10.5)
nRBC: 0 % (ref 0.0–0.2)

## 2020-01-13 MED ORDER — SODIUM CHLORIDE 0.9% FLUSH: 3.0000 mL | Freq: Once | INTRAVENOUS | Status: DC

## 2020-01-13 NOTE — ED Triage Notes (Signed)
Pt comes via POV from home with c/o left face and leg numbness. Pt states this started Sunday. Pt states left eye pain.   Pt denies any blurry vision. Pt states headache.  Pt denies any weakness or dizziness

## 2020-01-14 NOTE — ED Provider Notes (Signed)
Patient eloped after being roomed in the ED. I was unable to assess the patient. Labs reviewed show no apparent significant abnormalities and CT head shows no acute intracranial findings.   Shaune Pollack, MD 01/14/20 0201

## 2020-04-14 ENCOUNTER — Other Ambulatory Visit: Payer: Self-pay | Admitting: Neurology

## 2020-04-14 DIAGNOSIS — R569 Unspecified convulsions: Secondary | ICD-10-CM

## 2020-04-14 DIAGNOSIS — G43119 Migraine with aura, intractable, without status migrainosus: Secondary | ICD-10-CM

## 2020-05-15 ENCOUNTER — Other Ambulatory Visit: Payer: Self-pay

## 2020-05-15 ENCOUNTER — Emergency Department
Admission: EM | Admit: 2020-05-15 | Discharge: 2020-05-15 | Disposition: A | Discharge status: Home / Self Care | Payer: Medicaid Other | Attending: Emergency Medicine | Admitting: Emergency Medicine

## 2020-05-15 ENCOUNTER — Encounter: Payer: Self-pay | Admitting: Intensive Care

## 2020-05-15 DIAGNOSIS — K625 Hemorrhage of anus and rectum: Secondary | ICD-10-CM | POA: Diagnosis present

## 2020-05-15 DIAGNOSIS — F17210 Nicotine dependence, cigarettes, uncomplicated: Secondary | ICD-10-CM | POA: Diagnosis not present

## 2020-05-15 DIAGNOSIS — K649 Unspecified hemorrhoids: Secondary | ICD-10-CM

## 2020-05-15 LAB — COMPREHENSIVE METABOLIC PANEL
ALT: 7 U/L (ref 0–44)
AST: 13 U/L — ABNORMAL LOW (ref 15–41)
Albumin: 4.2 g/dL (ref 3.5–5.0)
Alkaline Phosphatase: 50 U/L (ref 38–126)
Anion gap: 10 (ref 5–15)
BUN: 11 mg/dL (ref 6–20)
CO2: 22 mmol/L (ref 22–32)
Calcium: 8.9 mg/dL (ref 8.9–10.3)
Chloride: 105 mmol/L (ref 98–111)
Creatinine, Ser: 0.97 mg/dL (ref 0.44–1.00)
GFR, Estimated: >60 mL/min (ref >60)
Glucose, Bld: 96 mg/dL (ref 70–99)
Potassium: 3.9 mmol/L (ref 3.5–5.1)
Sodium: 137 mmol/L (ref 135–145)
Total Bilirubin: 0.4 mg/dL (ref 0.3–1.2)
Total Protein: 7.8 g/dL (ref 6.5–8.1)

## 2020-05-15 LAB — CBC
HCT: 35.8 % — ABNORMAL LOW (ref 36.0–46.0)
Hemoglobin: 11.9 g/dL — ABNORMAL LOW (ref 12.0–15.0)
MCH: 28 pg (ref 26.0–34.0)
MCHC: 33.2 g/dL (ref 30.0–36.0)
MCV: 84.2 fL (ref 80.0–100.0)
Platelets: 297 10*3/uL (ref 150–400)
RBC: 4.25 MIL/uL (ref 3.87–5.11)
RDW: 14.4 % (ref 11.5–15.5)
WBC: 5 10*3/uL (ref 4.0–10.5)
nRBC: 0 % (ref 0.0–0.2)

## 2020-05-15 LAB — TYPE AND SCREEN
ABO/RH(D): O POS
Antibody Screen: NEGATIVE

## 2020-05-15 MED ORDER — DOCUSATE SODIUM 100 MG PO CAPS: 100.0000 mg | ORAL_CAPSULE | Freq: Two times a day (BID) | ORAL | 0 refills | Status: AC

## 2020-05-15 MED ORDER — HYDROCORTISONE ACETATE 25 MG RE SUPP: 25.0000 mg | Freq: Two times a day (BID) | RECTAL | 1 refills | Status: AC

## 2020-05-15 NOTE — ED Notes (Signed)
Pt signed printed d/c paperwork.  

## 2020-05-15 NOTE — ED Notes (Signed)
This RN at bedside with EDP Jessup during rectal exam. Hemorrhoid noted. Not currently bleeding.

## 2020-05-15 NOTE — ED Notes (Signed)
Pt denies pain, abd discomfort, fever, changes with urination or BMs, history of hemorrhoids, dizziness, fatigue, chills, SOB. Pt resp reg/unlabored, skin dry, and sitting calmly in bed.

## 2020-05-15 NOTE — ED Triage Notes (Signed)
Patient reports bright red rectal bleeding that started yesterday. Denies pain. Denies history of rectal bleeding

## 2020-05-15 NOTE — ED Provider Notes (Signed)
[facility] Emergency Department Provider Note   ____________________________________________   Event Date/Time   First MD Initiated Contact with Patient 05/15/20 1326     (approximate)  I have reviewed the triage vital signs and the nursing notes.   HISTORY  Chief Complaint Rectal Bleeding    HPI Nancy Watson is a 40 y.o. female with past medical history of migraines who presents to the ED for rectal bleeding.  Patient reports that yesterday she started to notice some blood when wiping after a bowel movement. Today, she felt the need to have a bowel movement but subsequently passed bright red blood into the toilet bowl. She has not had any further bleeding since then and denies any rectal pain, abdominal pain, nausea, or vomiting. She has never had rectal bleeding in the past and denies any history of hemorrhoids. She does not take any blood thinners, denies any chest pain, shortness of breath, or lightheadedness.        Past Medical History:  Diagnosis Date  . Migraines   . Syncope     Patient Active Problem List   Diagnosis Date Noted  . Pneumothorax on left 03/07/2018  . Labor and delivery, indication for care 05/11/2016  . Indication for care in labor or delivery 04/27/2016  . Poor fetal growth affecting management of mother in third trimester 04/24/2016  . Abdominal pressure 03/24/2016  . Preterm labor 03/07/2016  . Abdominal pain affecting pregnancy 02/27/2016  . Pelvic pain 01/31/2016  . First trimester screening 11/18/2015  . Right ovarian cyst 10/03/2015  . Post-operative state 10/03/2015    Past Surgical History:  Procedure Laterality Date  . DILATION AND CURETTAGE OF UTERUS  09/2015  . LAPAROSCOPIC SALPINGO OOPHERECTOMY Right 10/03/2015   Procedure:  D&C,right ovarian cystectomy laparoscopic ,laparoscopic excision right ovarian mass;  Surgeon: Suzy Bouchard, MD;  Location: [facility] ORS;  Service: Gynecology;  Laterality:  Right;  . TUBAL LIGATION Bilateral 05/13/2016   Procedure: POST PARTUM TUBAL LIGATION;  Surgeon: Suzy Bouchard, MD;  Location: [facility] ORS;  Service: Gynecology;  Laterality: Bilateral;    Prior to Admission medications   Medication Sig Start Date End Date Taking? Authorizing Provider  docusate sodium (COLACE) 100 MG capsule Take 1 capsule (100 mg total) by mouth 2 (two) times daily. 05/15/20 06/14/20 Yes Chesley Noon, MD  hydrocortisone (ANUSOL-HC) 25 MG suppository Place 1 suppository (25 mg total) rectally every 12 (twelve) hours for 12 days. 05/15/20 05/27/20 Yes Chesley Noon, MD  cephALEXin (KEFLEX) 500 MG capsule Take 1 capsule (500 mg total) by mouth 3 (three) times daily. 07/07/19   Tommi Rumps, PA-C  gabapentin (NEURONTIN) 300 MG capsule Take 1 capsule (300 mg total) by mouth 3 (three) times daily. 03/18/18   Pabon, Diego F, MD  predniSONE (STERAPRED UNI-PAK 21 TAB) 10 MG (21) TBPK tablet Take 6 pills on day one then decrease by 1 pill each day 09/26/19   Faythe Ghee, PA-C    Allergies Patient has no known allergies.  History reviewed. No pertinent family history.  Social History Social History   Tobacco Use  . Smoking status: Current Every Day Smoker    Packs/day: 0.25    Types: Cigarettes  . Smokeless tobacco: Never Used  Vaping Use  . Vaping Use: Never used  Substance Use Topics  . Alcohol use: No  . Drug use: No    Review of Systems  Constitutional: No fever/chills Eyes: No visual changes. ENT: No sore throat. Cardiovascular:  Denies chest pain. Respiratory: Denies shortness of breath. Gastrointestinal: No abdominal pain.  No nausea, no vomiting.  No diarrhea.  No constipation. Positive for rectal bleeding. Genitourinary: Negative for dysuria. Musculoskeletal: Negative for back pain. Skin: Negative for rash. Neurological: Negative for headaches, focal weakness or numbness.  ____________________________________________   PHYSICAL  EXAM:  VITAL SIGNS: ED Triage Vitals  Enc Vitals Group     BP 05/15/20 1213 112/81     Pulse Rate 05/15/20 1213 88     Resp 05/15/20 1213 18     Temp 05/15/20 1213 98.9 F (37.2 C)     Temp src --      SpO2 05/15/20 1213 100 %     Weight 05/15/20 1213 141 lb (64 kg)     Height 05/15/20 1213 5\' 6"  (1.676 m)     Head Circumference --      Peak Flow --      Pain Score 05/15/20 1221 0     Pain Loc --      Pain Edu? --      Excl. in GC? --     Constitutional: Alert and oriented. Eyes: Conjunctivae are normal. Head: Atraumatic. Nose: No congestion/rhinnorhea. Mouth/Throat: Mucous membranes are moist. Neck: Normal ROM Cardiovascular: Normal rate, regular rhythm. Grossly normal heart sounds. Respiratory: Normal respiratory effort.  No retractions. Lungs CTAB. Gastrointestinal: Soft and nontender. No distention. Nonbleeding hemorrhoid noted on rectal exam. Genitourinary: deferred Musculoskeletal: No lower extremity tenderness nor edema. Neurologic:  Normal speech and language. No gross focal neurologic deficits are appreciated. Skin:  Skin is warm, dry and intact. No rash noted. Psychiatric: Mood and affect are normal. Speech and behavior are normal.  ____________________________________________   LABS (all labs ordered are listed, but only abnormal results are displayed)  Labs Reviewed  COMPREHENSIVE METABOLIC PANEL - Abnormal; Notable for the following components:      Result Value   AST 13 (*)    All other components within normal limits  CBC - Abnormal; Notable for the following components:   Hemoglobin 11.9 (*)    HCT 35.8 (*)    All other components within normal limits  TYPE AND SCREEN    PROCEDURES  Procedure(s) performed (including Critical Care):  Procedures   ____________________________________________   INITIAL IMPRESSION / ASSESSMENT AND PLAN / ED COURSE       40 year old female with past medical history of migraines who presents to the ED with  rectal bleeding starting yesterday. Exam reveals nonbleeding hemorrhoid and I doubt significant GI bleeding as patient has stable hemoglobin from previous. Abdominal exam is soft and nontender, patient denies any hematemesis. She is appropriate for discharge home with PCP follow-up, and we will prescribe Colace and Anusol for her hemorrhoids. She was counseled to return to the ED for new worsening symptoms, patient agrees with plan.      ____________________________________________   FINAL CLINICAL IMPRESSION(S) / ED DIAGNOSES  Final diagnoses:  Rectal bleeding  Hemorrhoids, unspecified hemorrhoid type     ED Discharge Orders         Ordered    docusate sodium (COLACE) 100 MG capsule  2 times daily        05/15/20 1343    hydrocortisone (ANUSOL-HC) 25 MG suppository  Every 12 hours        05/15/20 1343           Note:  This document was prepared using Dragon voice recognition software and may include unintentional dictation errors.   07/13/20,  MD 05/15/20 1352

## 2020-05-24 ENCOUNTER — Ambulatory Visit: Admission: RE | Admit: 2020-05-24 | Payer: Medicaid Other | Source: Ambulatory Visit

## 2020-06-26 ENCOUNTER — Other Ambulatory Visit: Payer: Self-pay

## 2020-06-26 ENCOUNTER — Emergency Department
Admission: EM | Admit: 2020-06-26 | Discharge: 2020-06-26 | Disposition: A | Discharge status: Home / Self Care | Payer: Medicaid Other | Attending: Emergency Medicine | Admitting: Emergency Medicine

## 2020-06-26 DIAGNOSIS — L299 Pruritus, unspecified: Secondary | ICD-10-CM | POA: Diagnosis present

## 2020-06-26 DIAGNOSIS — R350 Frequency of micturition: Secondary | ICD-10-CM | POA: Diagnosis not present

## 2020-06-26 DIAGNOSIS — T887XXA Unspecified adverse effect of drug or medicament, initial encounter: Secondary | ICD-10-CM | POA: Insufficient documentation

## 2020-06-26 DIAGNOSIS — R42 Dizziness and giddiness: Secondary | ICD-10-CM | POA: Diagnosis not present

## 2020-06-26 DIAGNOSIS — R55 Syncope and collapse: Secondary | ICD-10-CM | POA: Insufficient documentation

## 2020-06-26 DIAGNOSIS — R300 Dysuria: Secondary | ICD-10-CM | POA: Insufficient documentation

## 2020-06-26 DIAGNOSIS — F17210 Nicotine dependence, cigarettes, uncomplicated: Secondary | ICD-10-CM | POA: Diagnosis not present

## 2020-06-26 DIAGNOSIS — T3695XA Adverse effect of unspecified systemic antibiotic, initial encounter: Secondary | ICD-10-CM | POA: Insufficient documentation

## 2020-06-26 DIAGNOSIS — T50905A Adverse effect of unspecified drugs, medicaments and biological substances, initial encounter: Secondary | ICD-10-CM

## 2020-06-26 LAB — COMPREHENSIVE METABOLIC PANEL
ALT: 9 U/L (ref 0–44)
AST: 20 U/L (ref 15–41)
Albumin: 4.3 g/dL (ref 3.5–5.0)
Alkaline Phosphatase: 49 U/L (ref 38–126)
Anion gap: 12 (ref 5–15)
BUN: 8 mg/dL (ref 6–20)
CO2: 16 mmol/L — ABNORMAL LOW (ref 22–32)
Calcium: 8.8 mg/dL — ABNORMAL LOW (ref 8.9–10.3)
Chloride: 107 mmol/L (ref 98–111)
Creatinine, Ser: 0.87 mg/dL (ref 0.44–1.00)
GFR, Estimated: >60 mL/min (ref >60)
Glucose, Bld: 150 mg/dL — ABNORMAL HIGH (ref 70–99)
Potassium: 4.1 mmol/L (ref 3.5–5.1)
Sodium: 135 mmol/L (ref 135–145)
Total Bilirubin: 0.7 mg/dL (ref 0.3–1.2)
Total Protein: 8.1 g/dL (ref 6.5–8.1)

## 2020-06-26 LAB — CBC WITH DIFFERENTIAL/PLATELET
Abs Immature Granulocytes: 0.04 10*3/uL (ref 0.00–0.07)
Basophils Absolute: 0 10*3/uL (ref 0.0–0.1)
Basophils Relative: 0 %
Eosinophils Absolute: 0.2 10*3/uL (ref 0.0–0.5)
Eosinophils Relative: 3 %
HCT: 41.6 % (ref 36.0–46.0)
Hemoglobin: 14.1 g/dL (ref 12.0–15.0)
Immature Granulocytes: 1 %
Lymphocytes Relative: 64 %
Lymphs Abs: 3.4 10*3/uL (ref 0.7–4.0)
MCH: 28.7 pg (ref 26.0–34.0)
MCHC: 33.9 g/dL (ref 30.0–36.0)
MCV: 84.7 fL (ref 80.0–100.0)
Monocytes Absolute: 0.4 10*3/uL (ref 0.1–1.0)
Monocytes Relative: 7 %
Neutro Abs: 1.3 10*3/uL — ABNORMAL LOW (ref 1.7–7.7)
Neutrophils Relative %: 25 %
Platelets: 275 10*3/uL (ref 150–400)
RBC: 4.91 MIL/uL (ref 3.87–5.11)
RDW: 14.5 % (ref 11.5–15.5)
WBC: 5.4 10*3/uL (ref 4.0–10.5)
nRBC: 0 % (ref 0.0–0.2)

## 2020-06-26 LAB — URINALYSIS, COMPLETE (UACMP) WITH MICROSCOPIC
Bilirubin Urine: NEGATIVE
Glucose, UA: NEGATIVE mg/dL
Ketones, ur: NEGATIVE mg/dL
Leukocytes,Ua: NEGATIVE
Nitrite: NEGATIVE
Protein, ur: NEGATIVE mg/dL
Specific Gravity, Urine: 1.013 (ref 1.005–1.030)
pH: 6 (ref 5.0–8.0)

## 2020-06-26 LAB — POC URINE PREG, ED: Preg Test, Ur: NEGATIVE

## 2020-06-26 MED ORDER — LACTATED RINGERS IV BOLUS
1000.0000 mL | Freq: Once | INTRAVENOUS | Status: AC
  Administered 2020-06-26: 1000 mL via INTRAVENOUS

## 2020-06-26 NOTE — ED Notes (Signed)
Pt has been give crackers and pb for PO trial

## 2020-06-26 NOTE — ED Triage Notes (Signed)
BIB EMS from home. Found in car by [facility]. Patient called 911 because she thought she was having an allergic reaction to CIPRO that was filled in January.. She started feeling like she was itching all over and her mouth felt funny.  EMS admin 0.3 EPI. 50MG  of benadryl, and 4 mg of zofran. IV placed by EMS in [facility]. BP 90/60 HR 120 LS clear. RR 16

## 2020-06-26 NOTE — ED Provider Notes (Signed)
[facility] Emergency Department Provider Note ____________________________________________   Event Date/Time   First MD Initiated Contact with Patient 06/26/20 1204     (approximate)  I have reviewed the triage vital signs and the nursing notes.  HISTORY  Chief Complaint Allergic Reaction   HPI Nancy Watson is a 40 y.o. femalewho presents to the ED for evaluation of possible allergic reaction.  Chart review indicates history of syncope, for which she was evaluated by cardiology 3 months ago.  Otherwise history of anxiety, depression, GERD, migraines.  Holter monitor placed and patient maintained a sinus rhythm with occasional PACs and occasional PVCs.  Single 6 beat nonsustained SVT and asymptomatic.  Patient was brought to the ED via EMS due to possible allergic reaction.  EMS reports finding the patient at home tachycardic with soft pressures, complaining of diffuse pruritus, so she was provided EpiPen, 50 mg of IV Benadryl and Zofran prehospital.  Patient reports that she was prescribed ciprofloxacin by her PCP back in January for a UTI.  She reports taking this medication for about 2 days without any issues, and was told by her PCP to switch to another antibiotic based off of the urine culture.  She was switched to amoxicillin and reports completing this course with resolution of her symptoms.  Patient reports that for the past few days that she has had increased urinary frequency and dysuria concerning for possible repeat UTI.  She reports finding her old prescription for ciprofloxacin and taking a tablet this morning.  She reports that about 5 minutes after taking this medication, she reports feeling burning and itching sensation globally throughout her body.  She reports a sensation of dizziness and is uncertain if she syncopized.  She reports calling 911 due to this burning sensation.  Denies any sensation of throat closure, abdominal pain or  emesis.  Past Medical History:  Diagnosis Date  . Migraines   . Syncope     Patient Active Problem List   Diagnosis Date Noted  . Pneumothorax on left 03/07/2018  . Labor and delivery, indication for care 05/11/2016  . Indication for care in labor or delivery 04/27/2016  . Poor fetal growth affecting management of mother in third trimester 04/24/2016  . Abdominal pressure 03/24/2016  . Preterm labor 03/07/2016  . Abdominal pain affecting pregnancy 02/27/2016  . Pelvic pain 01/31/2016  . First trimester screening 11/18/2015  . Right ovarian cyst 10/03/2015  . Post-operative state 10/03/2015    Past Surgical History:  Procedure Laterality Date  . DILATION AND CURETTAGE OF UTERUS  09/2015  . LAPAROSCOPIC SALPINGO OOPHERECTOMY Right 10/03/2015   Procedure:  D&C,right ovarian cystectomy laparoscopic ,laparoscopic excision right ovarian mass;  Surgeon: Suzy Bouchard, MD;  Location: [facility] ORS;  Service: Gynecology;  Laterality: Right;  . TUBAL LIGATION Bilateral 05/13/2016   Procedure: POST PARTUM TUBAL LIGATION;  Surgeon: Suzy Bouchard, MD;  Location: [facility] ORS;  Service: Gynecology;  Laterality: Bilateral;    Prior to Admission medications   Medication Sig Start Date End Date Taking? Authorizing Provider  cephALEXin (KEFLEX) 500 MG capsule Take 1 capsule (500 mg total) by mouth 3 (three) times daily. 07/07/19   Tommi Rumps, PA-C  gabapentin (NEURONTIN) 300 MG capsule Take 1 capsule (300 mg total) by mouth 3 (three) times daily. 03/18/18   Pabon, Diego F, MD  predniSONE (STERAPRED UNI-PAK 21 TAB) 10 MG (21) TBPK tablet Take 6 pills on day one then decrease by 1 pill each day 09/26/19  Faythe Ghee, PA-C    Allergies Patient has no known allergies.  No family history on file.  Social History Social History   Tobacco Use  . Smoking status: Current Every Day Smoker    Packs/day: 0.25    Types: Cigarettes  . Smokeless tobacco: Never Used  Vaping Use  .  Vaping Use: Never used  Substance Use Topics  . Alcohol use: No  . Drug use: No   Review of Systems  Constitutional: No fever/chills.  Positive for generalized weakness and generalized burning sensation.  Positive for dizziness Eyes: No visual changes. ENT: No sore throat. Cardiovascular: Denies chest pain. Respiratory: Denies shortness of breath. Gastrointestinal: No abdominal pain.  No nausea, no vomiting.  No diarrhea.  No constipation. Genitourinary: Negative for dysuria. Musculoskeletal: Negative for back pain. Skin: Negative for rash. Neurological: Negative for headaches, focal weakness or numbness. ____________________________________________   PHYSICAL EXAM:  VITAL SIGNS: Vitals:   06/26/20 1300 06/26/20 1430  BP: 112/76 (!) 93/47  Pulse:  65  Resp: 19 17  Temp:    SpO2:  98%     Constitutional: Alert and oriented. Well appearing and in no acute distress. Eyes: Conjunctivae are normal. PERRL. EOMI. Head: Atraumatic. Nose: No congestion/rhinnorhea. Mouth/Throat: Mucous membranes are dry.  Oropharynx non-erythematous. Neck: No stridor. No cervical spine tenderness to palpation. Cardiovascular: Normal rate, regular rhythm. Grossly normal heart sounds.  Good peripheral circulation. Respiratory: Normal respiratory effort.  No retractions. Lungs CTAB. Gastrointestinal: Soft , nondistended, nontender to palpation. No CVA tenderness. Musculoskeletal: No lower extremity tenderness nor edema.  No joint effusions. No signs of acute trauma. Neurologic:  Normal speech and language. No gross focal neurologic deficits are appreciated. No gait instability noted. Cranial nerves II through XII intact 5/5 strength and sensation in all 4 extremities Skin:  Skin is warm, dry and intact. No rash noted. Psychiatric: Mood and affect are normal. Speech and behavior are normal.  ____________________________________________   LABS (all labs ordered are listed, but only abnormal  results are displayed)  Labs Reviewed  COMPREHENSIVE METABOLIC PANEL - Abnormal; Notable for the following components:      Result Value   CO2 16 (*)    Glucose, Bld 150 (*)    Calcium 8.8 (*)    All other components within normal limits  CBC WITH DIFFERENTIAL/PLATELET - Abnormal; Notable for the following components:   Neutro Abs 1.3 (*)    All other components within normal limits  URINALYSIS, COMPLETE (UACMP) WITH MICROSCOPIC - Abnormal; Notable for the following components:   Color, Urine YELLOW (*)    APPearance HAZY (*)    Hgb urine dipstick LARGE (*)    Bacteria, UA RARE (*)    All other components within normal limits  POC URINE PREG, ED - Normal   ____________________________________________  12 Lead EKG  Sinus rhythm, rate of 95 bpm.  Normal axis and intervals.  No evidence of acute ischemia.   ____________________________________________   PROCEDURES and INTERVENTIONS  Procedure(s) performed (including Critical Care):  .1-3 Lead EKG Interpretation Performed by: Delton Prairie, MD Authorized by: Delton Prairie, MD     Interpretation: normal     ECG rate:  90   ECG rate assessment: normal     Rhythm: sinus rhythm     Ectopy: none     Conduction: normal      Medications  lactated ringers bolus 1,000 mL (1,000 mLs Intravenous New Bag/Given 06/26/20 1218)    ____________________________________________   MDM / ED COURSE  40 year old female presents to the ED for evaluation of a possible allergic reaction, with more evidence of vasovagal syncope than allergic reaction, and ultimately amenable to outpatient management.  Normal vitals on room air.  Exam without evidence of distress, any trauma or neurovascular deficits.  She looks well, though is noted to have dry mucous membranes.  Work-up is benign.  EKG is nonischemic without interval changes to suggest cardiac etiology of syncope.  Blood work with decrease in her bicarbonate, suggestive of dehydration,  otherwise unremarkable.  Urine without infectious features.  Provided fluid resuscitation with improvement of her symptoms, she was observed for about 3.5 hours without evidence of allergic reaction, anaphylaxis or need for any repeat EpiPen dosing.  When hearing her description of symptoms, I suspect a vasovagal episode with a possible syncopal component.  Less likely allergic reaction, though I did recommend that she safely dispose of ciprofloxacin at a local pharmacy.  We discussed return precautions for the ED and patient is stable for outpatient management.   Clinical Course as of 06/26/20 1522  Sat Jun 26, 2020  1249 Reassessed.  Patient reports feeling better and I acquire additional history. [DS]  1342 Reassessed.  Patient reports feeling well.  Eager to go home.  Awaiting urinalysis. [DS]  1413 Reassuring urinalysis without infectious features.  We will observe for another hour or so, with a p.o. challenge, to ensure no need for repeat dosing of epinephrine [DS]  1515 Reassessed.  Patient reports feeling well.  We discussed outpatient management and return precautions for the ED. [DS]    Clinical Course User Index [DS] Delton Prairie, MD    ____________________________________________   FINAL CLINICAL IMPRESSION(S) / ED DIAGNOSES  Final diagnoses:  Medication reaction, initial encounter     ED Discharge Orders    None       Raychelle Hudman Katrinka Blazing   Note:  This document was prepared using Dragon voice recognition software and may include unintentional dictation errors.   Delton Prairie, MD 06/26/20 1524

## 2020-06-26 NOTE — Discharge Instructions (Signed)
As we discussed, your urine does not show signs of UTI or infection.  Do not use any more ciprofloxacin, and get rid of these medications at a local pharmacy.  Please take Tylenol and ibuprofen/Advil for your pain.  It is safe to take them together, or to alternate them every few hours.  Take up to 1000mg  of Tylenol at a time, up to 4 times per day.  Do not take more than 4000 mg of Tylenol in 24 hours.  For ibuprofen, take 400-600 mg, 4-5 times per day.  Push fluids to stay hydrated and return to the ED with any worsening symptoms.

## 2020-06-26 NOTE — ED Notes (Signed)
Pt has been provided with discharge instructions. Pt denies any questions or concerns at this time. Pt verbalizes understanding for follow up care and d/c.  VSS. Pt verbally consented to receiving instructions.  Pt left department with all belongings.

## 2020-06-26 NOTE — ED Notes (Signed)
Pt is able to tolerate PO trial

## 2021-10-02 ENCOUNTER — Encounter: Payer: Self-pay | Admitting: Emergency Medicine

## 2021-10-02 ENCOUNTER — Emergency Department
Admission: EM | Admit: 2021-10-02 | Discharge: 2021-10-02 | Disposition: A | Discharge status: Home / Self Care | Payer: Medicaid Other | Attending: Emergency Medicine | Admitting: Emergency Medicine

## 2021-10-02 ENCOUNTER — Other Ambulatory Visit: Payer: Self-pay

## 2021-10-02 DIAGNOSIS — R102 Pelvic and perineal pain: Secondary | ICD-10-CM

## 2021-10-02 DIAGNOSIS — R1030 Lower abdominal pain, unspecified: Secondary | ICD-10-CM | POA: Diagnosis present

## 2021-10-02 DIAGNOSIS — N390 Urinary tract infection, site not specified: Secondary | ICD-10-CM | POA: Diagnosis not present

## 2021-10-02 LAB — CBC
HCT: 36 % (ref 36.0–46.0)
Hemoglobin: 11.9 g/dL — ABNORMAL LOW (ref 12.0–15.0)
MCH: 28.1 pg (ref 26.0–34.0)
MCHC: 33.1 g/dL (ref 30.0–36.0)
MCV: 84.9 fL (ref 80.0–100.0)
Platelets: 295 10*3/uL (ref 150–400)
RBC: 4.24 MIL/uL (ref 3.87–5.11)
RDW: 14.3 % (ref 11.5–15.5)
WBC: 6 10*3/uL (ref 4.0–10.5)
nRBC: 0 % (ref 0.0–0.2)

## 2021-10-02 LAB — URINALYSIS, ROUTINE W REFLEX MICROSCOPIC
Bilirubin Urine: NEGATIVE
Glucose, UA: NEGATIVE mg/dL
Hgb urine dipstick: NEGATIVE
Ketones, ur: NEGATIVE mg/dL
Nitrite: NEGATIVE
Protein, ur: 30 mg/dL — AB
Specific Gravity, Urine: 1.026 (ref 1.005–1.030)
pH: 7 (ref 5.0–8.0)

## 2021-10-02 LAB — COMPREHENSIVE METABOLIC PANEL
ALT: 8 U/L (ref 0–44)
AST: 14 U/L — ABNORMAL LOW (ref 15–41)
Albumin: 3.7 g/dL (ref 3.5–5.0)
Alkaline Phosphatase: 65 U/L (ref 38–126)
Anion gap: 6 (ref 5–15)
BUN: 15 mg/dL (ref 6–20)
CO2: 25 mmol/L (ref 22–32)
Calcium: 8.8 mg/dL — ABNORMAL LOW (ref 8.9–10.3)
Chloride: 108 mmol/L (ref 98–111)
Creatinine, Ser: 0.81 mg/dL (ref 0.44–1.00)
GFR, Estimated: >60 mL/min (ref >60)
Glucose, Bld: 98 mg/dL (ref 70–99)
Potassium: 4.3 mmol/L (ref 3.5–5.1)
Sodium: 139 mmol/L (ref 135–145)
Total Bilirubin: 0.4 mg/dL (ref 0.3–1.2)
Total Protein: 7.2 g/dL (ref 6.5–8.1)

## 2021-10-02 LAB — PREGNANCY, URINE: Preg Test, Ur: NEGATIVE

## 2021-10-02 LAB — LIPASE, BLOOD: Lipase: 30 U/L (ref 11–51)

## 2021-10-02 MED ORDER — CEFDINIR 300 MG PO CAPS: 300.0000 mg | ORAL_CAPSULE | Freq: Two times a day (BID) | ORAL | 0 refills | Status: AC

## 2021-10-02 MED ORDER — KETOROLAC TROMETHAMINE 30 MG/ML IJ SOLN
30.0000 mg | Freq: Once | INTRAMUSCULAR | Status: AC
Start: 2021-10-02 — End: 2021-10-02
  Administered 2021-10-02: 30 mg via INTRAVENOUS
  Filled 2021-10-02: qty 1

## 2021-10-02 NOTE — ED Triage Notes (Signed)
Pt reports abd pain across her abd that is achy in nature for the past 3 days. Pt reports some nausea as well. Denies vomiting, diarrhea, constipation or urinary sx's.

## 2021-10-02 NOTE — ED Provider Notes (Signed)
[facility] Provider Note   Event Date/Time   First MD Initiated Contact with Patient 10/02/21 0813     (approximate) History  Abdominal Pain and Nausea  HPI Nancy Watson is a 41 y.o. female  Location: Suprapubic region Duration: 3 days prior to arrival Timing: Worsening since onset Severity: 9/10 Quality: Aching pain Context: Patient states began having suprapubic abdominal pain that is worsened over the last 3 days Modifying factors: Denies any exacerbating or relieving factors Associated Symptoms: Nausea ROS: Patient currently denies any vision changes, tinnitus, difficulty speaking, facial droop, sore throat, chest pain, shortness of breath, abdominal pain, vomiting/diarrhea, dysuria, or weakness/numbness/paresthesias in any extremity   Physical Exam  Triage Vital Signs: ED Triage Vitals [10/02/21 0806]  Enc Vitals Group     BP 100/83     Pulse Rate 84     Resp 20     Temp 98.3 F (36.8 C)     Temp Source Oral     SpO2 100 %     Weight 130 lb (59 kg)     Height 5\' 6"  (1.676 m)     Head Circumference      Peak Flow      Pain Score 9     Pain Loc      Pain Edu?      Excl. in GC?    Most recent vital signs: Vitals:   10/02/21 0806  BP: 100/83  Pulse: 84  Resp: 20  Temp: 98.3 F (36.8 C)  SpO2: 100%   General: Awake, oriented x4. CV:  Good peripheral perfusion.  Resp:  Normal effort.  Abd:  No distention.  Other:  Middle-aged African-American female laying in bed in no distress ED Results / Procedures / Treatments  Labs (all labs ordered are listed, but only abnormal results are displayed) Labs Reviewed  COMPREHENSIVE METABOLIC PANEL - Abnormal; Notable for the following components:      Result Value   Calcium 8.8 (*)    AST 14 (*)    All other components within normal limits  CBC - Abnormal; Notable for the following components:   Hemoglobin 11.9 (*)    All other components within normal limits  URINALYSIS, ROUTINE W  REFLEX MICROSCOPIC - Abnormal; Notable for the following components:   Color, Urine YELLOW (*)    APPearance CLOUDY (*)    Protein, ur 30 (*)    Leukocytes,Ua TRACE (*)    Bacteria, UA RARE (*)    All other components within normal limits  LIPASE, BLOOD  PREGNANCY, URINE   PROCEDURES: Critical Care performed: No Procedures MEDICATIONS ORDERED IN ED: Medications - No data to display IMPRESSION / MDM / ASSESSMENT AND PLAN / ED COURSE  I reviewed the triage vital signs and the nursing notes.                             The patient is on the cardiac monitor to evaluate for evidence of arrhythmia and/or significant heart rate changes. Patient's presentation is most consistent with acute presentation with potential threat to life or bodily function. Not Pregnant. Unlikely TOA, Ovarian Torsion, PID, gonorrhea/chlamydia. Low suspicion for Infected Urolithiasis, AAA, Cholecystitis, Pancreatitis, SBO, Appendicitis, or other acute abdomen.  Rx: Cefdinir 300 mg BID for 5 days Disposition: Discharge home. SRP discussed. Advise follow up with primary care provider within 24-72 hours.   FINAL CLINICAL IMPRESSION(S) / ED DIAGNOSES   Final diagnoses:  Suprapubic pain  Lower urinary tract infectious disease   Rx / DC Orders   ED Discharge Orders          Ordered    cefdinir (OMNICEF) 300 MG capsule  2 times daily        10/02/21 9937           Note:  This document was prepared using Dragon voice recognition software and may include unintentional dictation errors.   Merwyn Katos, MD 10/02/21 [phone]

## 2021-10-02 NOTE — Discharge Instructions (Addendum)
Please use phenazopyridine for any further suprapubic abdominal pain. Please use ibuprofen (Motrin) up to 800 mg every 8 hours, naproxen (Naprosyn) up to 500 mg every 12 hours, and/or acetaminophen (Tylenol) up to 4 g/day for any continued pain

## 2021-10-02 NOTE — ED Notes (Signed)
Pt in bed, pt reports a slight decrease in pain, pt states that she is ready to go home, pt verbalized understanding d/c and follow up. Pt ambulatory from dpt

## 2022-01-30 ENCOUNTER — Emergency Department: Payer: Medicaid Other

## 2022-01-30 ENCOUNTER — Other Ambulatory Visit: Payer: Self-pay

## 2022-01-30 ENCOUNTER — Emergency Department
Admission: EM | Admit: 2022-01-30 | Discharge: 2022-01-30 | Disposition: A | Discharge status: Home / Self Care | Payer: Medicaid Other | Attending: Emergency Medicine | Admitting: Emergency Medicine

## 2022-01-30 DIAGNOSIS — Y93G1 Activity, food preparation and clean up: Secondary | ICD-10-CM | POA: Diagnosis not present

## 2022-01-30 DIAGNOSIS — W228XXA Striking against or struck by other objects, initial encounter: Secondary | ICD-10-CM | POA: Diagnosis not present

## 2022-01-30 DIAGNOSIS — R55 Syncope and collapse: Secondary | ICD-10-CM | POA: Insufficient documentation

## 2022-01-30 DIAGNOSIS — S92424A Nondisplaced fracture of distal phalanx of right great toe, initial encounter for closed fracture: Secondary | ICD-10-CM | POA: Diagnosis not present

## 2022-01-30 DIAGNOSIS — S99921A Unspecified injury of right foot, initial encounter: Secondary | ICD-10-CM | POA: Diagnosis present

## 2022-01-30 LAB — CBC
HCT: 37 % (ref 36.0–46.0)
Hemoglobin: 12.1 g/dL (ref 12.0–15.0)
MCH: 28.1 pg (ref 26.0–34.0)
MCHC: 32.7 g/dL (ref 30.0–36.0)
MCV: 85.8 fL (ref 80.0–100.0)
Platelets: 289 10*3/uL (ref 150–400)
RBC: 4.31 MIL/uL (ref 3.87–5.11)
RDW: 14 % (ref 11.5–15.5)
WBC: 5 10*3/uL (ref 4.0–10.5)
nRBC: 0 % (ref 0.0–0.2)

## 2022-01-30 LAB — CBG MONITORING, ED: Glucose-Capillary: 91 mg/dL (ref 70–99)

## 2022-01-30 LAB — BASIC METABOLIC PANEL
Anion gap: 5 (ref 5–15)
BUN: 15 mg/dL (ref 6–20)
CO2: 23 mmol/L (ref 22–32)
Calcium: 8.4 mg/dL — ABNORMAL LOW (ref 8.9–10.3)
Chloride: 108 mmol/L (ref 98–111)
Creatinine, Ser: 0.79 mg/dL (ref 0.44–1.00)
GFR, Estimated: >60 mL/min (ref >60)
Glucose, Bld: 88 mg/dL (ref 70–99)
Potassium: 4 mmol/L (ref 3.5–5.1)
Sodium: 136 mmol/L (ref 135–145)

## 2022-01-30 LAB — URINALYSIS, ROUTINE W REFLEX MICROSCOPIC
Bacteria, UA: NONE SEEN
Bilirubin Urine: NEGATIVE
Glucose, UA: NEGATIVE mg/dL
Ketones, ur: NEGATIVE mg/dL
Leukocytes,Ua: NEGATIVE
Nitrite: NEGATIVE
Protein, ur: NEGATIVE mg/dL
Specific Gravity, Urine: 1.014 (ref 1.005–1.030)
pH: 6 (ref 5.0–8.0)

## 2022-01-30 LAB — POC URINE PREG, ED: Preg Test, Ur: NEGATIVE

## 2022-01-30 MED ORDER — KETOROLAC TROMETHAMINE 30 MG/ML IJ SOLN
15.0000 mg | Freq: Once | INTRAMUSCULAR | Status: AC
  Administered 2022-01-30: 15 mg via INTRAVENOUS
  Filled 2022-01-30: qty 1

## 2022-01-30 MED ORDER — METOCLOPRAMIDE HCL 5 MG/ML IJ SOLN
10.0000 mg | Freq: Once | INTRAMUSCULAR | Status: AC
  Administered 2022-01-30: 10 mg via INTRAVENOUS
  Filled 2022-01-30: qty 2

## 2022-01-30 MED ORDER — ONDANSETRON 4 MG PO TBDP: 4.0000 mg | ORAL_TABLET | Freq: Three times a day (TID) | ORAL | 0 refills | Status: DC | PRN

## 2022-01-30 MED ORDER — LACTATED RINGERS IV BOLUS
1000.0000 mL | Freq: Once | INTRAVENOUS | Status: AC
  Administered 2022-01-30: 1000 mL via INTRAVENOUS

## 2022-01-30 MED ORDER — HYDROCODONE-ACETAMINOPHEN 5-325 MG PO TABS: 1.0000 | ORAL_TABLET | Freq: Four times a day (QID) | ORAL | 0 refills | Status: DC | PRN

## 2022-01-30 MED ORDER — HYDROCODONE-ACETAMINOPHEN 5-325 MG PO TABS
1.0000 | ORAL_TABLET | Freq: Once | ORAL | Status: AC
  Administered 2022-01-30: 1 via ORAL
  Filled 2022-01-30: qty 1

## 2022-01-30 MED ORDER — DIPHENHYDRAMINE HCL 50 MG/ML IJ SOLN
25.0000 mg | Freq: Once | INTRAMUSCULAR | Status: AC
  Administered 2022-01-30: 25 mg via INTRAVENOUS
  Filled 2022-01-30: qty 1

## 2022-01-30 NOTE — ED Notes (Signed)
Pt taken to CT.

## 2022-01-30 NOTE — ED Triage Notes (Signed)
Pt states that she has near syncope spells x a few years and was washing dishes when she suddenly felt dizzy and "passed out". Pt states that when she woke up she had a headache. Denies hitting head.

## 2022-01-30 NOTE — ED Provider Notes (Signed)
[facility] Provider Note    Event Date/Time   First MD Initiated Contact with Patient 01/30/22 0725     (approximate)   History   Near Syncope   HPI  Nancy Watson is a 41 y.o. female with history of recurrent syncope here with syncopal episode.  The patient has actually had 2 syncopal episodes in left arm for hours.  She states her first episode was last night.  She states she was making food in the kitchen when she began to feel very lightheaded and dizzy.  She leaned forward and braced herself on the kitchen counter.  She then states she lost consciousness but her daughter who had been in the room saw her and lowered her to the ground.  She not hit her head.  She states she does believe she hit her toe during the episode and has had aching, throbbing, right great toe pain since then.  She has also had some mild fatigue.  This morning, the patient was getting ready for work when she again began to feel lightheaded.  She lost her vision after beginning to see spots.  She then lost consciousness again.  She does not know how long she was down, possibly several minutes.  Since then, she has had an aching, throbbing, generalized headache.  This is not unusual for her and she has a history of recurrent syncopal episodes and has seen cardiology and neurology.  She states she was told it was vasovagal.  She states that she always has a headache after these episodes.  No current focal numbness or weakness.     Physical Exam   Triage Vital Signs: ED Triage Vitals  Enc Vitals Group     BP 01/30/22 0530 94/63     Pulse Rate 01/30/22 0530 95     Resp 01/30/22 0530 20     Temp 01/30/22 0530 98.4 F (36.9 C)     Temp Source 01/30/22 0530 Oral     SpO2 01/30/22 0530 96 %     Weight 01/30/22 0531 135 lb (61.2 kg)     Height 01/30/22 0531 5\' 6"  (1.676 m)     Head Circumference --      Peak Flow --      Pain Score 01/30/22 0530 7     Pain Loc --      Pain Edu? --       Excl. in GC? --     Most recent vital signs: Vitals:   01/30/22 0842 01/30/22 0930  BP: 110/74   Pulse: 67 69  Resp: 12 19  Temp: 98.4 F (36.9 C)   SpO2: 100% 100%     General: Awake, no distress.  CV:  Good peripheral perfusion.  Regular rate and rhythm.  No murmurs rubs. Resp:  Normal effort.  Lungs clear to auscultation bilaterally. Abd:  No distention.  No tenderness. Other:  Cranial nerves II through XII intact.  Strength out of 5 bilateral upper and lower extremity.  Normal station light touch.  Gait is normal.  Alert and oriented x4.  Patient has moderate tenderness to palpation over the right great toe, particularly over the MTP joint, no obvious deformity or bruising.  No swelling.   ED Results / Procedures / Treatments   Labs (all labs ordered are listed, but only abnormal results are displayed) Labs Reviewed  BASIC METABOLIC PANEL - Abnormal; Notable for the following components:      Result Value  Calcium 8.4 (*)    All other components within normal limits  URINALYSIS, ROUTINE W REFLEX MICROSCOPIC - Abnormal; Notable for the following components:   Color, Urine YELLOW (*)    APPearance HAZY (*)    Hgb urine dipstick SMALL (*)    All other components within normal limits  CBC  CBG MONITORING, ED  POC URINE PREG, ED     EKG Normal sinus rhythm, ventricular rate 70.  PR 120, QRS 84, QTc 421.  No acute ST elevations or depressions.  No acute evidence of acute ischemia or infarct.   RADIOLOGY CT head: No acute intra abnormality DG right great toe: Negative   I also independently reviewed and agree with radiologist interpretations.   PROCEDURES:  Critical Care performed: No  Procedures    MEDICATIONS ORDERED IN ED: Medications  lactated ringers bolus 1,000 mL (1,000 mLs Intravenous New Bag/Given 01/30/22 0841)  metoCLOPramide (REGLAN) injection 10 mg (10 mg Intravenous Given 01/30/22 0828)  diphenhydrAMINE (BENADRYL) injection 25 mg (25  mg Intravenous Given 01/30/22 0830)  ketorolac (TORADOL) 30 MG/ML injection 15 mg (15 mg Intravenous Given 01/30/22 0921)  HYDROcodone-acetaminophen (NORCO/VICODIN) 5-325 MG per tablet 1 tablet (1 tablet Oral Given 01/30/22 0921)     IMPRESSION / MDM / ASSESSMENT AND PLAN / ED COURSE  I reviewed the triage vital signs and the nursing notes.                               The patient is on the cardiac monitor to evaluate for evidence of arrhythmia and/or significant heart rate changes.   Ddx:  Differential includes the following, with pertinent life- or limb-threatening emergencies considered:  Vasovagal syncope, orthostasis, possible complicated migraine, anemia, arrhythmia/cardiogenic syncope, less likely PE  Patient's presentation is most consistent with acute presentation with potential threat to life or bodily function.  MDM:  41 year old female here with recurrent syncope and right great toe pain.  Regarding her toe pain, imaging shows proximal phalanx fracture of the great toe.  Will place in a walking boot and have her follow-up with orthopedics as an outpatient.  RICE therapy encouraged.  Regarding her syncope, patient has had an extensive history of similar episodes.  She has had neurology and cardiology evaluation.  She has no leukocytosis or anemia.  Electrolytes are acceptable on BMP.  UPT is negative.  UA unremarkable.  CT head is negative.  She is back to her baseline.  No ectopy noted on telemetry here and EKG is nonischemic.  Interestingly, her symptoms do seem to correlate with post syncopal headache on nearly every occasion.  She has some vision changes with these episodes.  Query possible atypical presentation of complex migraine.  Will refer her back to neurology.  No apparent emergent pathology and as mentioned, this is a longstanding and recurrent issue.   MEDICATIONS GIVEN IN ED: Medications  lactated ringers bolus 1,000 mL (1,000 mLs Intravenous New Bag/Given  01/30/22 0841)  metoCLOPramide (REGLAN) injection 10 mg (10 mg Intravenous Given 01/30/22 0828)  diphenhydrAMINE (BENADRYL) injection 25 mg (25 mg Intravenous Given 01/30/22 0830)  ketorolac (TORADOL) 30 MG/ML injection 15 mg (15 mg Intravenous Given 01/30/22 0921)  HYDROcodone-acetaminophen (NORCO/VICODIN) 5-325 MG per tablet 1 tablet (1 tablet Oral Given 01/30/22 6606)     Consults:  None   EMR reviewed  Prior Neuro evaluations for syncope and migraines     FINAL CLINICAL IMPRESSION(S) / ED DIAGNOSES   Final  diagnoses:  Syncope and collapse  Nondisplaced fracture of distal phalanx of right great toe, initial encounter for closed fracture     Rx / DC Orders   ED Discharge Orders          Ordered    HYDROcodone-acetaminophen (NORCO/VICODIN) 5-325 MG tablet  Every 6 hours PRN        01/30/22 1008    ondansetron (ZOFRAN-ODT) 4 MG disintegrating tablet  Every 8 hours PRN        01/30/22 1008             Note:  This document was prepared using Dragon voice recognition software and may include unintentional dictation errors.   Shaune Pollack, MD 01/30/22 1010

## 2022-01-30 NOTE — Discharge Instructions (Addendum)
Wear your boot at all times when walking until cleared by Orthopedics in the next 1-2 weeks  Take over-the-counter tylenol and ibuprofen for pain.  Drink plenty of fluids.  Call your neurologist to discuss your Er visit and to arrange follow-up as your syncopal episodes could be related to complex migraines

## 2022-02-10 ENCOUNTER — Other Ambulatory Visit: Payer: Self-pay | Admitting: Neurology

## 2022-02-10 DIAGNOSIS — R55 Syncope and collapse: Secondary | ICD-10-CM

## 2022-02-27 ENCOUNTER — Ambulatory Visit
Admission: RE | Admit: 2022-02-27 | Discharge: 2022-02-27 | Disposition: A | Discharge status: Home / Self Care | Payer: Medicaid Other | Source: Ambulatory Visit | Attending: Neurology | Admitting: Neurology

## 2022-02-27 DIAGNOSIS — R55 Syncope and collapse: Secondary | ICD-10-CM | POA: Insufficient documentation

## 2022-02-27 MED ORDER — GADOBUTROL 1 MMOL/ML IV SOLN
6.0000 mL | Freq: Once | INTRAVENOUS | Status: AC | PRN
  Administered 2022-02-27: 6 mL via INTRAVENOUS

## 2022-03-05 ENCOUNTER — Emergency Department
Admission: EM | Admit: 2022-03-05 | Discharge: 2022-03-06 | Discharge status: Against Medical Advice | Payer: Medicaid Other | Attending: Emergency Medicine | Admitting: Emergency Medicine

## 2022-03-05 ENCOUNTER — Other Ambulatory Visit: Payer: Self-pay

## 2022-03-05 ENCOUNTER — Encounter: Payer: Self-pay | Admitting: Emergency Medicine

## 2022-03-05 DIAGNOSIS — J101 Influenza due to other identified influenza virus with other respiratory manifestations: Secondary | ICD-10-CM | POA: Insufficient documentation

## 2022-03-05 DIAGNOSIS — Z5321 Procedure and treatment not carried out due to patient leaving prior to being seen by health care provider: Secondary | ICD-10-CM | POA: Insufficient documentation

## 2022-03-05 DIAGNOSIS — Z1152 Encounter for screening for COVID-19: Secondary | ICD-10-CM | POA: Insufficient documentation

## 2022-03-05 DIAGNOSIS — R519 Headache, unspecified: Secondary | ICD-10-CM | POA: Diagnosis present

## 2022-03-05 LAB — RESP PANEL BY RT-PCR (FLU A&B, COVID) ARPGX2
Influenza A by PCR: POSITIVE — AB
Influenza B by PCR: NEGATIVE
SARS Coronavirus 2 by RT PCR: NEGATIVE

## 2022-03-05 MED ORDER — ACETAMINOPHEN 325 MG PO TABS
650.0000 mg | ORAL_TABLET | Freq: Once | ORAL | Status: AC | PRN
  Administered 2022-03-05: 650 mg via ORAL
  Filled 2022-03-05: qty 2

## 2022-03-05 NOTE — ED Triage Notes (Signed)
Pt called for treatment no response

## 2022-03-05 NOTE — ED Triage Notes (Signed)
Pt called for treatment, no response

## 2022-03-05 NOTE — ED Triage Notes (Signed)
Patient to ED via POV for bodyaches. Patient also c/o runny nose, headadche, and scratchy throat. Symptoms started yesterday.

## 2022-03-05 NOTE — ED Provider Notes (Signed)
Patient was brought into a triage room but eloped prior to me being able to assess her.   Shaune Pollack, MD 03/05/22 2251

## 2022-03-06 ENCOUNTER — Other Ambulatory Visit: Payer: Self-pay

## 2022-03-06 ENCOUNTER — Emergency Department
Admission: EM | Admit: 2022-03-06 | Discharge: 2022-03-06 | Disposition: A | Discharge status: Home / Self Care | Payer: Medicaid Other | Source: Home / Self Care | Attending: Emergency Medicine | Admitting: Emergency Medicine

## 2022-03-06 DIAGNOSIS — J101 Influenza due to other identified influenza virus with other respiratory manifestations: Secondary | ICD-10-CM | POA: Insufficient documentation

## 2022-03-06 MED ORDER — OSELTAMIVIR PHOSPHATE 75 MG PO CAPS: 75.0000 mg | ORAL_CAPSULE | Freq: Two times a day (BID) | ORAL | 0 refills | Status: AC

## 2022-03-06 MED ORDER — PSEUDOEPH-BROMPHEN-DM 30-2-10 MG/5ML PO SYRP
5.0000 mL | ORAL_SOLUTION | Freq: Four times a day (QID) | ORAL | 0 refills | Status: DC | PRN
Start: 2022-03-06 — End: 2022-11-20

## 2022-03-06 NOTE — ED Provider Triage Note (Signed)
Emergency Medicine Provider Triage Evaluation Note  Nancy Watson , a 41 y.o. female  was evaluated in triage.  Pt complains of feeling bad. Was here last pm but left without being seen.  Here for test results.   Review of Systems  Positive: Fever, body aches. Negative: No vomiting  Physical Exam  BP 104/73   Pulse 94   Temp 99 F (37.2 C)   Resp 18   Ht 5\' 6"  (1.676 m)   Wt 59 kg   SpO2 96%   BMI 20.98 kg/m  Gen:   Awake, no distress   Resp:  Normal effort  MSK:   Moves extremities without difficulty  Other:    Medical Decision Making  Medically screening exam initiated at 3:08 PM.  Appropriate orders placed.  Marlisha Vanwyk Wirkkala was informed that the remainder of the evaluation will be completed by another provider, this initial triage assessment does not replace that evaluation, and the importance of remaining in the ED until their evaluation is complete.     Gardner Candle, PA-C 03/06/22 1510

## 2022-03-06 NOTE — ED Triage Notes (Signed)
Pt to ED for fever, body ache. States was here yesterday but did not stay for results.

## 2022-03-06 NOTE — ED Provider Notes (Signed)
[facility] Provider Note    Event Date/Time   First MD Initiated Contact with Patient 03/06/22 1514     (approximate)   History   Fever   HPI  Magenta Schmiesing Genter is a 41 y.o. female   presents to the ED to get the results of her tests that were done last evening.  Patient eloped from the ED prior to being seen.  She states she still has the same symptoms and has taken some over-the-counter medications.  No new symptoms.      Physical Exam   Triage Vital Signs: ED Triage Vitals  Enc Vitals Group     BP 03/06/22 1503 104/73     Pulse Rate 03/06/22 1503 94     Resp 03/06/22 1503 18     Temp 03/06/22 1503 99 F (37.2 C)     Temp src --      SpO2 03/06/22 1503 96 %     Weight 03/06/22 1504 130 lb (59 kg)     Height 03/06/22 1504 5\' 6"  (1.676 m)     Head Circumference --      Peak Flow --      Pain Score 03/06/22 1503 6     Pain Loc --      Pain Edu? --      Excl. in GC? --     Most recent vital signs: Vitals:   03/06/22 1503  BP: 104/73  Pulse: 94  Resp: 18  Temp: 99 F (37.2 C)  SpO2: 96%     General: Awake, no distress.  CV:  Good peripheral perfusion.  Resp:  Normal effort.  Lungs are clear bilaterally.  Occasional congested cough. Abd:  No distention.  Other:     ED Results / Procedures / Treatments   Labs (all labs ordered are listed, but only abnormal results are displayed) Labs Reviewed - No data to display    PROCEDURES:  Critical Care performed:   Procedures   MEDICATIONS ORDERED IN ED: Medications - No data to display   IMPRESSION / MDM / ASSESSMENT AND PLAN / ED COURSE  I reviewed the triage vital signs and the nursing notes.   Differential diagnosis includes, but is not limited to, viral URI, COVID, influenza, bronchitis.  41 year old female presents to the ED with no new complaints but came to the emergency department yesterday and left prior to being seen.  She is here primarily today to find the  results of her test.  She was made aware that her influenza test was positive and that she is contagious.  A prescription for Tamiflu was sent to the pharmacy along with Bromfed-DM as needed for cough and congestion.  She is encouraged to use Tylenol or ibuprofen as needed for fever, aches and and headache.  Also stressed staying hydrated with drinking lots of fluids.  Also a work note was given.      Patient's presentation is most consistent with acute complicated illness / injury requiring diagnostic workup.  FINAL CLINICAL IMPRESSION(S) / ED DIAGNOSES   Final diagnoses:  Influenza A     Rx / DC Orders   ED Discharge Orders          Ordered    oseltamivir (TAMIFLU) 75 MG capsule  2 times daily        03/06/22 1517    brompheniramine-pseudoephedrine-DM 30-2-10 MG/5ML syrup  4 times daily PRN        03/06/22 1517  Note:  This document was prepared using Dragon voice recognition software and may include unintentional dictation errors.   Tommi Rumps, PA-C 03/06/22 1530    Georga Hacking, MD 03/07/22 2042

## 2022-03-22 NOTE — ED Provider Notes (Signed)
Left without being seen.    Shaune Pollack, MD 03/22/22 [phone]

## 2022-06-14 ENCOUNTER — Other Ambulatory Visit: Payer: Self-pay | Admitting: Family Medicine

## 2022-06-14 DIAGNOSIS — Z1231 Encounter for screening mammogram for malignant neoplasm of breast: Secondary | ICD-10-CM

## 2022-08-24 ENCOUNTER — Emergency Department
Admission: EM | Admit: 2022-08-24 | Discharge: 2022-08-24 | Disposition: A | Discharge status: Home / Self Care | Payer: Medicaid Other | Attending: Emergency Medicine | Admitting: Emergency Medicine

## 2022-08-24 ENCOUNTER — Emergency Department: Payer: Medicaid Other

## 2022-08-24 DIAGNOSIS — N939 Abnormal uterine and vaginal bleeding, unspecified: Secondary | ICD-10-CM | POA: Diagnosis present

## 2022-08-24 DIAGNOSIS — R102 Pelvic and perineal pain: Secondary | ICD-10-CM | POA: Diagnosis not present

## 2022-08-24 LAB — CBC
HCT: 37.7 % (ref 36.0–46.0)
Hemoglobin: 12.7 g/dL (ref 12.0–15.0)
MCH: 28.4 pg (ref 26.0–34.0)
MCHC: 33.7 g/dL (ref 30.0–36.0)
MCV: 84.3 fL (ref 80.0–100.0)
Platelets: 325 10*3/uL (ref 150–400)
RBC: 4.47 MIL/uL (ref 3.87–5.11)
RDW: 14.6 % (ref 11.5–15.5)
WBC: 6.5 10*3/uL (ref 4.0–10.5)
nRBC: 0 % (ref 0.0–0.2)

## 2022-08-24 LAB — BASIC METABOLIC PANEL
Anion gap: 7 (ref 5–15)
BUN: 9 mg/dL (ref 6–20)
CO2: 22 mmol/L (ref 22–32)
Calcium: 8.5 mg/dL — ABNORMAL LOW (ref 8.9–10.3)
Chloride: 106 mmol/L (ref 98–111)
Creatinine, Ser: 0.8 mg/dL (ref 0.44–1.00)
GFR, Estimated: >60 mL/min (ref >60)
Glucose, Bld: 114 mg/dL — ABNORMAL HIGH (ref 70–99)
Potassium: 3.7 mmol/L (ref 3.5–5.1)
Sodium: 135 mmol/L (ref 135–145)

## 2022-08-24 LAB — POC URINE PREG, ED: Preg Test, Ur: NEGATIVE

## 2022-08-24 NOTE — ED Notes (Signed)
Pt taken to US

## 2022-08-24 NOTE — Discharge Instructions (Addendum)
Please call the number provided to arrange a follow-up appointment with Dr. Dalbert Garnet of OB/GYN.  You will need a repeat ultrasound in approximately 6 weeks to evaluate some small calcifications on the uterus as well as a right ovarian cyst.  Return to the emergency department for any significant bleeding or any other symptom personally concerning to yourself.

## 2022-08-24 NOTE — ED Notes (Signed)
Urine sent to lab 

## 2022-08-24 NOTE — ED Triage Notes (Signed)
Pt reports that she has been having vaginal bleeding since intercourse 3 weeks ago. States that she had her period before that on 5/1. Reports cramping pain.

## 2022-08-24 NOTE — ED Provider Notes (Signed)
[facility] Provider Note    Event Date/Time   First MD Initiated Contact with Patient 08/24/22 2026     (approximate)  History   Chief Complaint: Vaginal Bleeding  HPI  Nancy Watson is a 42 y.o. female with no significant past medical history presents emergency department for vaginal bleeding.  According to the patient for the past 3 weeks she has been experiencing some intermittent vaginal bleeding.  Patient states this started soon after intercourse.  Although denies any abdominal or pelvic pain.  Patient states a normal menstrual cycle at the beginning of this month.  Patient states she sees her primary care doctor for regular physical exams including a Pap smear last year that was normal per patient.  Physical Exam   Triage Vital Signs: ED Triage Vitals [08/24/22 1932]  Enc Vitals Group     BP 126/87     Pulse Rate 76     Resp 16     Temp 98.6 F (37 C)     Temp Source Oral     SpO2 99 %     Weight 139 lb (63 kg)     Height 5\' 6"  (1.676 m)     Head Circumference      Peak Flow      Pain Score 9     Pain Loc      Pain Edu?      Excl. in GC?     Most recent vital signs: Vitals:   08/24/22 1932  BP: 126/87  Pulse: 76  Resp: 16  Temp: 98.6 F (37 C)  SpO2: 99%    General: Awake, no distress.  CV:  Good peripheral perfusion.  Regular rate and rhythm  Resp:  Normal effort.  Equal breath sounds bilaterally.  Abd:  No distention.  Soft, nontender.  No rebound or guarding.  Benign abdomen  ED Results / Procedures / Treatments   RADIOLOGY  Ultrasound had resulted showing small echogenic foci within the endometrium could represent benign microcalcifications recommend follow-up in 6 to 12 weeks.  Small complex right ovarian cyst recommend follow-up in 6 weeks.   MEDICATIONS ORDERED IN ED: Medications - No data to display   IMPRESSION / MDM / ASSESSMENT AND PLAN / ED COURSE  I reviewed the triage vital signs and the nursing  notes.  Patient's presentation is most consistent with acute presentation with potential threat to life or bodily function.  Patient presents emergency department for vaginal bleeding intermittent over the past 3 weeks.  Overall the patient appears well, no distress.  No abdominal pain and benign abdomen on exam.  Patient CBC is normal, chemistry is normal.  Patient states she had a normal Pap smear last year and follows up with her doctor for regular exams.  Will obtain an ultrasound as a precaution.  Given the reassuring lab work I believe the patient would likely be safe for discharge home with outpatient follow-up.  Depending on the ultrasound patient may need to follow-up with an OB/GYN versus her PCP.  Patient's pregnancy test is negative, normal CBC with a stable H&H, chemistry is normal.  Patient's ultrasound has resulted showing no significant finding does have microcalcifications as well as a right ovarian cyst recommend 6-week follow-up for both.  Will refer to OB/GYN for further workup.  Patient agreeable to plan of care.  FINAL CLINICAL IMPRESSION(S) / ED DIAGNOSES   Dysfunctional uterine bleeding   Note:  This document was prepared using Dragon voice recognition  software and may include unintentional dictation errors.   Minna Antis, MD 08/24/22 2201

## 2022-10-10 ENCOUNTER — Other Ambulatory Visit: Payer: Self-pay | Admitting: Obstetrics and Gynecology

## 2022-10-10 DIAGNOSIS — N83201 Unspecified ovarian cyst, right side: Secondary | ICD-10-CM

## 2022-10-10 DIAGNOSIS — N939 Abnormal uterine and vaginal bleeding, unspecified: Secondary | ICD-10-CM

## 2022-10-10 NOTE — Progress Notes (Signed)
Pt called for ED f/u for AUB/RTO cyst f/u. Will schedule GYN u/s first and then NP appt with me.

## 2022-10-31 ENCOUNTER — Encounter: Payer: Medicaid Other | Admitting: Obstetrics and Gynecology

## 2022-10-31 ENCOUNTER — Other Ambulatory Visit: Payer: Medicaid Other

## 2022-11-02 ENCOUNTER — Ambulatory Visit
Admission: RE | Admit: 2022-11-02 | Discharge: 2022-11-02 | Disposition: A | Discharge status: Home / Self Care | Payer: Medicaid Other | Source: Ambulatory Visit | Attending: Obstetrics and Gynecology | Admitting: Obstetrics and Gynecology

## 2022-11-02 DIAGNOSIS — N83201 Unspecified ovarian cyst, right side: Secondary | ICD-10-CM | POA: Diagnosis present

## 2022-11-02 DIAGNOSIS — N939 Abnormal uterine and vaginal bleeding, unspecified: Secondary | ICD-10-CM | POA: Diagnosis present

## 2022-11-19 NOTE — Progress Notes (Unsigned)
Center, [facility]   Chief Complaint  Patient presents with   Menorrhagia    HPI:      Ms. Nancy Watson is a 42 y.o. [phone] whose LMP was No LMP recorded (lmp unknown)., presents today for NP eval of menorrhagia/AUB. Menses were monthly lasting 7 days, heavy flow with 1/4 to 1/2 dollar sized clots prior to 5/24, no BTB, mod dysmen, not improved with NSAIDs. Started with AUB 5/24, PCP put pt on provera 10 mg for 5 or 7 days without relief. Pt then started bleeding again 6/24 and has been bleeding daily since. Flow can be heavy (same as usual period) vs lighter, with worse dysmen, now with LBP/radiating to legs. Is having vasomtor sx with AUB now. No hx of increased stress/wt changes prior to AUB. Went to ED 5/24 for AUB and found to have 1.9 cm complex RTO cyst on u/s; EM=6 mm, normal CBC. AUB persisted. F/u u/s 11/02/22 with 1.8 cm LTO cyst; resoled RTO cyst; EM=5 mm.  Pt is not sexually active since bleeding; s/p TL. No new partners.  Pap with PCP in past 1-2 yrs. No hx of abn paps (I can't see in Epic). Hx of migraines with aura.   Patient Active Problem List   Diagnosis Date Noted   Pneumothorax on left 03/07/2018   Labor and delivery, indication for care 05/11/2016   Indication for care in labor or delivery 04/27/2016   Poor fetal growth affecting management of mother in third trimester 04/24/2016   Abdominal pressure 03/24/2016   Preterm labor 03/07/2016   Abdominal pain affecting pregnancy 02/27/2016   Pelvic pain 01/31/2016   First trimester screening 11/18/2015   Right ovarian cyst 10/03/2015   Post-operative state 10/03/2015    Past Surgical History:  Procedure Laterality Date   DILATION AND CURETTAGE OF UTERUS  09/2015   LAPAROSCOPIC SALPINGO OOPHERECTOMY Right 10/03/2015   Procedure:  D&C,right ovarian cystectomy laparoscopic ,laparoscopic excision right ovarian mass;  Surgeon: Suzy Bouchard, MD;  Location: [facility] ORS;  Service: Gynecology;   Laterality: Right;   TUBAL LIGATION Bilateral 05/13/2016   Procedure: POST PARTUM TUBAL LIGATION;  Surgeon: Suzy Bouchard, MD;  Location: [facility] ORS;  Service: Gynecology;  Laterality: Bilateral;    Family History  Problem Relation Age of Onset   Breast cancer Cousin    Breast cancer Cousin     Social History   Socioeconomic History   Marital status: Single    Spouse name: Not on file   Number of children: Not on file   Years of education: Not on file   Highest education level: Not on file  Occupational History   Not on file  Tobacco Use   Smoking status: Every Day    Current packs/day: 0.25    Types: Cigarettes   Smokeless tobacco: Never  Vaping Use   Vaping status: Never Used  Substance and Sexual Activity   Alcohol use: No   Drug use: No   Sexual activity: Yes  Other Topics Concern   Not on file  Social History Narrative   Not on file   Social Determinants of Health   Financial Resource Strain: Not on file  Food Insecurity: Not on file  Transportation Needs: Not on file  Physical Activity: Not on file  Stress: Not on file  Social Connections: Not on file  Intimate Partner Violence: Not on file    Outpatient Medications Prior to Visit  Medication Sig Dispense Refill  brompheniramine-pseudoephedrine-DM 30-2-10 MG/5ML syrup Take 5 mLs by mouth 4 (four) times daily as needed. (Patient not taking: Reported on 11/20/2022) 120 mL 0   gabapentin (NEURONTIN) 300 MG capsule Take 1 capsule (300 mg total) by mouth 3 (three) times daily. (Patient not taking: Reported on 11/20/2022) 60 capsule 1   No facility-administered medications prior to visit.      ROS:  Review of Systems  Constitutional:  Negative for fever.  Gastrointestinal:  Negative for blood in stool, constipation, diarrhea, nausea and vomiting.  Genitourinary:  Positive for menstrual problem. Negative for dyspareunia, dysuria, flank pain, frequency, hematuria, urgency, vaginal bleeding, vaginal  discharge and vaginal pain.  Musculoskeletal:  Negative for back pain.  Skin:  Negative for rash.   BREAST: No symptoms   OBJECTIVE:   Vitals:  BP 117/88   Pulse 79   Resp 16   Ht 5\' 6"  (1.676 m)   Wt 141 lb (64 kg)   LMP  (LMP Unknown)   BMI 22.76 kg/m   Physical Exam Vitals reviewed.  Constitutional:      Appearance: She is well-developed.  Pulmonary:     Effort: Pulmonary effort is normal.  Genitourinary:    General: Normal vulva.     Pubic Area: No rash.      Labia:        Right: No rash, tenderness or lesion.        Left: No rash, tenderness or lesion.      Vagina: Bleeding present. No vaginal discharge, erythema or tenderness.     Cervix: Normal.     Uterus: Normal. Not enlarged and not tender.      Adnexa: Right adnexa normal and left adnexa normal.       Right: No mass or tenderness.         Left: No mass or tenderness.    Musculoskeletal:        General: Normal range of motion.     Cervical back: Normal range of motion.  Skin:    General: Skin is warm and dry.  Neurological:     General: No focal deficit present.     Mental Status: She is alert and oriented to person, place, and time.  Psychiatric:        Mood and Affect: Mood normal.        Behavior: Behavior normal.        Thought Content: Thought content normal.        Judgment: Judgment normal.    Assessment/Plan: Abnormal uterine bleeding (AUB) - Plan: Prolactin, TSH + free T4, CBC with Differential/Platelet, norethindrone (AYGESTIN) 5 MG tablet, sx since 5/24, neg GYN u/s for AUB. Check pap/labs. Will f/u with results. Discussed POPs to stop bleeding in meantime. Rx aygestin for 14 days. If labs neg, pt to f/u towards end of 14 days with update of bleeding. Will d/c vs RF.   Cervical cancer screening - Plan: Cytology - PAP  Screening for STD (sexually transmitted disease) - Plan: Cytology - PAP  Screening for deficiency anemia - Plan: CBC with Differential/Platelet  Thyroid disorder  screening - Plan: TSH + free T4  Cysts of both ovaries--resolved RTO cyst; has simple LTO cyst, no f/u needed.    Meds ordered this encounter  Medications   norethindrone (AYGESTIN) 5 MG tablet    Sig: Take 1 tablet (5 mg total) by mouth daily.    Dispense:  14 tablet    Refill:  0    Order Specific Question:  Supervising Provider    Answer:   Waymon Budge      Return if symptoms worsen or fail to improve.  Walton Digilio B. Mosetta Ferdinand, PA-C 11/20/2022 11:26 AM

## 2022-11-20 ENCOUNTER — Other Ambulatory Visit (HOSPITAL_COMMUNITY)
Admission: RE | Admit: 2022-11-20 | Discharge: 2022-11-20 | Disposition: A | Discharge status: Home / Self Care | Payer: Medicaid Other | Source: Ambulatory Visit | Attending: Obstetrics and Gynecology | Admitting: Obstetrics and Gynecology

## 2022-11-20 ENCOUNTER — Ambulatory Visit: Payer: Medicaid Other | Admitting: Obstetrics and Gynecology

## 2022-11-20 ENCOUNTER — Encounter: Payer: Self-pay | Admitting: Obstetrics and Gynecology

## 2022-11-20 VITALS — BP 117/88 | HR 79 | Resp 16 | Ht 66.0 in | Wt 141.0 lb

## 2022-11-20 DIAGNOSIS — Z113 Encounter for screening for infections with a predominantly sexual mode of transmission: Secondary | ICD-10-CM | POA: Diagnosis present

## 2022-11-20 DIAGNOSIS — N939 Abnormal uterine and vaginal bleeding, unspecified: Secondary | ICD-10-CM | POA: Diagnosis present

## 2022-11-20 DIAGNOSIS — Z1151 Encounter for screening for human papillomavirus (HPV): Secondary | ICD-10-CM | POA: Diagnosis not present

## 2022-11-20 DIAGNOSIS — Z124 Encounter for screening for malignant neoplasm of cervix: Secondary | ICD-10-CM | POA: Insufficient documentation

## 2022-11-20 DIAGNOSIS — Z130 Encounter for screening for diseases of the blood and blood-forming organs and certain disorders involving the immune mechanism: Secondary | ICD-10-CM

## 2022-11-20 DIAGNOSIS — Z01419 Encounter for gynecological examination (general) (routine) without abnormal findings: Secondary | ICD-10-CM | POA: Diagnosis present

## 2022-11-20 DIAGNOSIS — Z1329 Encounter for screening for other suspected endocrine disorder: Secondary | ICD-10-CM

## 2022-11-20 DIAGNOSIS — N83201 Unspecified ovarian cyst, right side: Secondary | ICD-10-CM

## 2022-11-20 MED ORDER — NORETHINDRONE ACETATE 5 MG PO TABS
5.0000 mg | ORAL_TABLET | Freq: Every day | ORAL | 0 refills | Status: DC
Start: 2022-11-20 — End: 2022-12-05

## 2022-11-20 NOTE — Patient Instructions (Addendum)
I value your feedback and you entrusting us with your care. If you get a [facility] patient survey, I would appreciate you taking the time to let us know about your experience today. Thank you!  [facility] ([place])--[phone]  

## 2022-11-21 LAB — CBC WITH DIFFERENTIAL/PLATELET
Basophils Absolute: 0.1 10*3/uL (ref 0.0–0.2)
Basos: 2 %
EOS (ABSOLUTE): 0.2 10*3/uL (ref 0.0–0.4)
Eos: 3 %
Hematocrit: 36.2 % (ref 34.0–46.6)
Hemoglobin: 11.5 g/dL (ref 11.1–15.9)
Immature Grans (Abs): 0 10*3/uL (ref 0.0–0.1)
Immature Granulocytes: 0 %
Lymphocytes Absolute: 2.3 10*3/uL (ref 0.7–3.1)
Lymphs: 46 %
MCH: 27.1 pg (ref 26.6–33.0)
MCHC: 31.8 g/dL (ref 31.5–35.7)
MCV: 85 fL (ref 79–97)
Monocytes Absolute: 0.4 10*3/uL (ref 0.1–0.9)
Monocytes: 8 %
Neutrophils Absolute: 2.1 10*3/uL (ref 1.4–7.0)
Neutrophils: 41 %
Platelets: 324 10*3/uL (ref 150–450)
RBC: 4.24 x10E6/uL (ref 3.77–5.28)
RDW: 13.2 % (ref 11.7–15.4)
WBC: 5.1 10*3/uL (ref 3.4–10.8)

## 2022-11-21 LAB — TSH+FREE T4
Free T4: 1.19 ng/dL (ref 0.82–1.77)
TSH: 0.779 u[IU]/mL (ref 0.450–4.500)

## 2022-11-21 LAB — PROLACTIN: Prolactin: 7.3 ng/mL (ref 4.8–33.4)

## 2022-11-22 LAB — CYTOLOGY - PAP
Chlamydia: NEGATIVE
Comment: NEGATIVE
Comment: NEGATIVE
Comment: NORMAL
Diagnosis: NEGATIVE
High risk HPV: NEGATIVE
Neisseria Gonorrhea: NEGATIVE

## 2022-12-04 ENCOUNTER — Encounter: Payer: Self-pay | Admitting: Obstetrics and Gynecology

## 2022-12-04 DIAGNOSIS — N939 Abnormal uterine and vaginal bleeding, unspecified: Secondary | ICD-10-CM

## 2022-12-05 MED ORDER — NORETHINDRONE ACETATE 5 MG PO TABS
5.0000 mg | ORAL_TABLET | Freq: Every day | ORAL | 0 refills | Status: DC
Start: 2022-12-05 — End: 2023-02-05

## 2023-01-30 ENCOUNTER — Encounter: Payer: Self-pay | Admitting: Emergency Medicine

## 2023-01-30 ENCOUNTER — Emergency Department: Payer: Medicaid Other

## 2023-01-30 ENCOUNTER — Emergency Department
Admission: EM | Admit: 2023-01-30 | Discharge: 2023-01-30 | Disposition: A | Discharge status: Home / Self Care | Payer: Medicaid Other | Attending: Student in an Organized Health Care Education/Training Program | Admitting: Student in an Organized Health Care Education/Training Program

## 2023-01-30 ENCOUNTER — Other Ambulatory Visit: Payer: Self-pay

## 2023-01-30 DIAGNOSIS — R519 Headache, unspecified: Secondary | ICD-10-CM | POA: Insufficient documentation

## 2023-01-30 LAB — BASIC METABOLIC PANEL
Anion gap: 7 (ref 5–15)
BUN: 8 mg/dL (ref 6–20)
CO2: 20 mmol/L — ABNORMAL LOW (ref 22–32)
Calcium: 8.4 mg/dL — ABNORMAL LOW (ref 8.9–10.3)
Chloride: 109 mmol/L (ref 98–111)
Creatinine, Ser: 0.78 mg/dL (ref 0.44–1.00)
GFR, Estimated: >60 mL/min (ref >60)
Glucose, Bld: 107 mg/dL — ABNORMAL HIGH (ref 70–99)
Potassium: 4 mmol/L (ref 3.5–5.1)
Sodium: 136 mmol/L (ref 135–145)

## 2023-01-30 LAB — CBC WITH DIFFERENTIAL/PLATELET
Abs Immature Granulocytes: 0.01 10*3/uL (ref 0.00–0.07)
Basophils Absolute: 0.1 10*3/uL (ref 0.0–0.1)
Basophils Relative: 1 %
Eosinophils Absolute: 0.1 10*3/uL (ref 0.0–0.5)
Eosinophils Relative: 3 %
HCT: 34.8 % — ABNORMAL LOW (ref 36.0–46.0)
Hemoglobin: 11.7 g/dL — ABNORMAL LOW (ref 12.0–15.0)
Immature Granulocytes: 0 %
Lymphocytes Relative: 40 %
Lymphs Abs: 1.9 10*3/uL (ref 0.7–4.0)
MCH: 27.9 pg (ref 26.0–34.0)
MCHC: 33.6 g/dL (ref 30.0–36.0)
MCV: 82.9 fL (ref 80.0–100.0)
Monocytes Absolute: 0.3 10*3/uL (ref 0.1–1.0)
Monocytes Relative: 7 %
Neutro Abs: 2.3 10*3/uL (ref 1.7–7.7)
Neutrophils Relative %: 49 %
Platelets: 331 10*3/uL (ref 150–400)
RBC: 4.2 MIL/uL (ref 3.87–5.11)
RDW: 15.2 % (ref 11.5–15.5)
WBC: 4.7 10*3/uL (ref 4.0–10.5)
nRBC: 0 % (ref 0.0–0.2)

## 2023-01-30 LAB — PROTIME-INR
INR: 1.1 (ref 0.8–1.2)
Prothrombin Time: 14.3 s (ref 11.4–15.2)

## 2023-01-30 MED ORDER — ACETAMINOPHEN 500 MG PO TABS
1000.0000 mg | ORAL_TABLET | Freq: Once | ORAL | Status: AC
  Administered 2023-01-30: 1000 mg via ORAL
  Filled 2023-01-30: qty 2

## 2023-01-30 MED ORDER — PROCHLORPERAZINE EDISYLATE 10 MG/2ML IJ SOLN
10.0000 mg | Freq: Once | INTRAMUSCULAR | Status: AC
  Administered 2023-01-30: 10 mg via INTRAVENOUS
  Filled 2023-01-30: qty 2

## 2023-01-30 MED ORDER — DIPHENHYDRAMINE HCL 50 MG/ML IJ SOLN
12.5000 mg | Freq: Once | INTRAMUSCULAR | Status: AC
  Administered 2023-01-30: 12.5 mg via INTRAVENOUS
  Filled 2023-01-30: qty 1

## 2023-01-30 NOTE — ED Provider Notes (Signed)
[facility] Provider Note    Event Date/Time   First MD Initiated Contact with Patient 01/30/23 [phone]     (approximate)   History   Migraine   HPI  Nancy Watson is a 42 y.o. female no significant past medical history but does have a history of recurrent migraine headaches presents to the ER for evaluation of headache frontal in nature does have photophobia some nausea.  Similar to previous migraine headaches but worse.  Denies any fevers or chills.  No neck stiffness.  No numbness tingling.  Awoke from sleep with pain.  Has not taken anything for the discomfort.     Physical Exam   Triage Vital Signs: ED Triage Vitals  Encounter Vitals Group     BP 01/30/23 0612 (!) 142/100     Systolic BP Percentile --      Diastolic BP Percentile --      Pulse Rate 01/30/23 0612 92     Resp 01/30/23 0612 18     Temp 01/30/23 0612 98.1 F (36.7 C)     Temp Source 01/30/23 0612 Oral     SpO2 01/30/23 0612 99 %     Weight 01/30/23 0610 140 lb (63.5 kg)     Height 01/30/23 0610 5\' 6"  (1.676 m)     Head Circumference --      Peak Flow --      Pain Score 01/30/23 0610 10     Pain Loc --      Pain Education --      Exclude from Growth Chart --     Most recent vital signs: Vitals:   01/30/23 0612 01/30/23 0709  BP: (!) 142/100   Pulse: 92   Resp: 18   Temp: 98.1 F (36.7 C) 97.7 F (36.5 C)  SpO2: 99%      Constitutional: Alert  Eyes: Conjunctivae are normal.  Head: Atraumatic. Nose: No congestion/rhinnorhea. Mouth/Throat: Mucous membranes are moist.   Neck: Painless ROM.  Cardiovascular:   Good peripheral circulation. Respiratory: Normal respiratory effort.  No retractions.  Gastrointestinal: Soft and nontender.  Musculoskeletal:  no deformity Neurologic:  MAE spontaneously. No gross focal neurologic deficits are appreciated.  Skin:  Skin is warm, dry and intact. No rash noted. Psychiatric: Mood and affect are normal. Speech and behavior are  normal.    ED Results / Procedures / Treatments   Labs (all labs ordered are listed, but only abnormal results are displayed) Labs Reviewed  CBC WITH DIFFERENTIAL/PLATELET - Abnormal; Notable for the following components:      Result Value   Hemoglobin 11.7 (*)    HCT 34.8 (*)    All other components within normal limits  BASIC METABOLIC PANEL - Abnormal; Notable for the following components:   CO2 20 (*)    Glucose, Bld 107 (*)    Calcium 8.4 (*)    All other components within normal limits  PROTIME-INR     EKG   RADIOLOGY Please see ED Course for my review and interpretation.  I personally reviewed all radiographic images ordered to evaluate for the above acute complaints and reviewed radiology reports and findings.  These findings were personally discussed with the patient.  Please see medical record for radiology report.    PROCEDURES:  Critical Care performed: No  Procedures   MEDICATIONS ORDERED IN ED: Medications  prochlorperazine (COMPAZINE) injection 10 mg (10 mg Intravenous Given 01/30/23 0725)  diphenhydrAMINE (BENADRYL) injection 12.5 mg (12.5 mg Intravenous Given  01/30/23 0726)  acetaminophen (TYLENOL) tablet 1,000 mg (1,000 mg Oral Given 01/30/23 0724)     IMPRESSION / MDM / ASSESSMENT AND PLAN / ED COURSE  I reviewed the triage vital signs and the nursing notes.                              Differential diagnosis includes, but is not limited to, migraine, tension, cluster, SDH, IPH  Patient presenting to the ER for evaluation of symptoms as described above.  Based on symptoms, risk factors and considered above differential, this presenting complaint could reflect a potentially life-threatening illness therefore the patient will be placed on continuous pulse oximetry and telemetry for monitoring.  Laboratory evaluation will be sent to evaluate for the above complaints.      Clinical Course as of 01/30/23 0926  Tue Jan 30, 2023  0925  Reassessed with resolution of her headache.  Requesting discharge at this time.  Does appear appropriate for outpatient follow-up. [PR]    Clinical Course User Index [PR] Willy Eddy, MD     FINAL CLINICAL IMPRESSION(S) / ED DIAGNOSES   Final diagnoses:  Bad headache     Rx / DC Orders   ED Discharge Orders     None        Note:  This document was prepared using Dragon voice recognition software and may include unintentional dictation errors.    Willy Eddy, MD 01/30/23 [phone]

## 2023-01-30 NOTE — Discharge Instructions (Addendum)
  FINDINGS: Brain: No evidence of acute infarction, hemorrhage, hydrocephalus, extra-axial collection or mass lesion/mass effect.   Vascular: No hyperdense vessel or unexpected calcification.   Skull: Normal. Negative for fracture or focal lesion.   Sinuses/Orbits: No acute finding.   IMPRESSION: Normal head CT.     Electronically Signed   By: Tiburcio Pea M.D.   On: 01/30/2023 08:21  As discussed in the emergency department, you may use Tylenol and/or Ibuprofen for headaches. These are "Over the Counter" medications and can be found at most drug stores and grocery stores. Please use the recommended dosing instructions on the bottle/box. Do not exceed the maximum dose for either medications. Please be sure to rest and drink plenty of fluids. Please be sure to call your PCP for a follow-up visit, especially if your headaches persist.  Please call your physician or return to ED if you have: 1. Worsening or change in headaches. 2. Changes in vision. 3. New-onset nausea and vomiting. 4. Numbness, tingling, weakness in your extremities,. 4. Inability to eat or drink adequate amounts of food or liquids. 5. Chest pain, shortness of breath, or difficulty breathing. 6. Neurological changes- dizziness, fainting, loss of function of your arms, legs or other parts of your body. 7. Uncontrolled hypertension. 8. Or any other emergent concerns.

## 2023-01-30 NOTE — ED Triage Notes (Addendum)
Patient ambulatory to triage with steady gait, without difficulty or distress noted; pt reports since 8pm having rt sided migraine accomp by nausea; st hx of same

## 2023-02-02 ENCOUNTER — Encounter: Payer: Self-pay | Admitting: Obstetrics and Gynecology

## 2023-02-05 ENCOUNTER — Other Ambulatory Visit: Payer: Self-pay | Admitting: Obstetrics and Gynecology

## 2023-02-05 DIAGNOSIS — N939 Abnormal uterine and vaginal bleeding, unspecified: Secondary | ICD-10-CM

## 2023-02-05 MED ORDER — NORETHINDRONE ACETATE 5 MG PO TABS: 5.0000 mg | ORAL_TABLET | Freq: Every day | ORAL | 2 refills | Status: AC

## 2023-02-05 NOTE — Progress Notes (Signed)
Rx RF aygestin for AUB. Pt doing well.

## 2023-05-26 ENCOUNTER — Emergency Department: Payer: Medicaid Other

## 2023-05-26 ENCOUNTER — Emergency Department
Admission: EM | Admit: 2023-05-26 | Discharge: 2023-05-26 | Disposition: A | Discharge status: Home / Self Care | Payer: Medicaid Other | Attending: Emergency Medicine | Admitting: Emergency Medicine

## 2023-05-26 DIAGNOSIS — R079 Chest pain, unspecified: Secondary | ICD-10-CM

## 2023-05-26 DIAGNOSIS — R0789 Other chest pain: Secondary | ICD-10-CM | POA: Diagnosis present

## 2023-05-26 DIAGNOSIS — R519 Headache, unspecified: Secondary | ICD-10-CM | POA: Diagnosis not present

## 2023-05-26 LAB — CBC WITH DIFFERENTIAL/PLATELET
Abs Immature Granulocytes: 0.01 10*3/uL (ref 0.00–0.07)
Basophils Absolute: 0.1 10*3/uL (ref 0.0–0.1)
Basophils Relative: 1 %
Eosinophils Absolute: 0.2 10*3/uL (ref 0.0–0.5)
Eosinophils Relative: 4 %
HCT: 37.4 % (ref 36.0–46.0)
Hemoglobin: 12.5 g/dL (ref 12.0–15.0)
Immature Granulocytes: 0 %
Lymphocytes Relative: 40 %
Lymphs Abs: 2.3 10*3/uL (ref 0.7–4.0)
MCH: 28.2 pg (ref 26.0–34.0)
MCHC: 33.4 g/dL (ref 30.0–36.0)
MCV: 84.4 fL (ref 80.0–100.0)
Monocytes Absolute: 0.5 10*3/uL (ref 0.1–1.0)
Monocytes Relative: 9 %
Neutro Abs: 2.7 10*3/uL (ref 1.7–7.7)
Neutrophils Relative %: 46 %
Platelets: 345 10*3/uL (ref 150–400)
RBC: 4.43 MIL/uL (ref 3.87–5.11)
RDW: 14.9 % (ref 11.5–15.5)
WBC: 5.9 10*3/uL (ref 4.0–10.5)
nRBC: 0 % (ref 0.0–0.2)

## 2023-05-26 LAB — COMPREHENSIVE METABOLIC PANEL
ALT: 13 U/L (ref 0–44)
AST: 18 U/L (ref 15–41)
Albumin: 4.1 g/dL (ref 3.5–5.0)
Alkaline Phosphatase: 44 U/L (ref 38–126)
Anion gap: 12 (ref 5–15)
BUN: 10 mg/dL (ref 6–20)
CO2: 20 mmol/L — ABNORMAL LOW (ref 22–32)
Calcium: 9.1 mg/dL (ref 8.9–10.3)
Chloride: 105 mmol/L (ref 98–111)
Creatinine, Ser: 0.84 mg/dL (ref 0.44–1.00)
GFR, Estimated: >60 mL/min (ref >60)
Glucose, Bld: 85 mg/dL (ref 70–99)
Potassium: 3.7 mmol/L (ref 3.5–5.1)
Sodium: 137 mmol/L (ref 135–145)
Total Bilirubin: 0.6 mg/dL (ref 0.0–1.2)
Total Protein: 7.8 g/dL (ref 6.5–8.1)

## 2023-05-26 LAB — TROPONIN I (HIGH SENSITIVITY)
Troponin I (High Sensitivity): <2 ng/L (ref <18)
Troponin I (High Sensitivity): 2 ng/L (ref <18)

## 2023-05-26 MED ORDER — PROCHLORPERAZINE EDISYLATE 10 MG/2ML IJ SOLN
10.0000 mg | Freq: Once | INTRAMUSCULAR | Status: AC
  Administered 2023-05-26: 10 mg via INTRAVENOUS
  Filled 2023-05-26: qty 2

## 2023-05-26 MED ORDER — DIPHENHYDRAMINE HCL 50 MG/ML IJ SOLN
25.0000 mg | Freq: Once | INTRAMUSCULAR | Status: AC
  Administered 2023-05-26: 25 mg via INTRAVENOUS
  Filled 2023-05-26: qty 1

## 2023-05-26 MED ORDER — ACETAMINOPHEN 500 MG PO TABS
1000.0000 mg | ORAL_TABLET | Freq: Once | ORAL | Status: AC
  Administered 2023-05-26: 1000 mg via ORAL
  Filled 2023-05-26: qty 2

## 2023-05-26 NOTE — Discharge Instructions (Signed)
 You are seen in the ER today for your chest pain and headache.  Your testing fortunately did not show an emergency cause for this.  Follow with your primary care doctor within the next few days for reevaluation.  Return to the ER for new or worsening symptoms.

## 2023-05-26 NOTE — ED Provider Notes (Signed)
 [facility] Provider Note    Event Date/Time   First MD Initiated Contact with Patient 05/26/23 1457     (approximate)   History   Chest Pain   HPI  Nancy Watson is a 43 year old female presenting to the emergency department for evaluation of chest pain, additionally reporting headache.  About 45 minutes prior to presentation, patient had onset of chest pain described as a pressure in the center of her chest.  Lasted less than an hour, resolved at the time of my initial evaluation.  Denies history of similar.  She additionally reports that since yesterday she has had an ongoing headache, typical for her prior migraines.  Reports she has previously required ER visits for treatment, denies any change in character from prior headaches.  No focal numbness, tingling, weakness.     Physical Exam   Triage Vital Signs: ED Triage Vitals  Encounter Vitals Group     BP 05/26/23 1426 137/83     Systolic BP Percentile --      Diastolic BP Percentile --      Pulse Rate 05/26/23 1424 67     Resp 05/26/23 1424 16     Temp 05/26/23 1424 98.7 F (37.1 C)     Temp Source 05/26/23 1424 Oral     SpO2 05/26/23 1424 100 %     Weight 05/26/23 1421 150 lb (68 kg)     Height 05/26/23 1421 5\' 6"  (1.676 m)     Head Circumference --      Peak Flow --      Pain Score 05/26/23 1421 10     Pain Loc --      Pain Education --      Exclude from Growth Chart --     Most recent vital signs: Vitals:   05/26/23 1530 05/26/23 1600  BP: 107/71 100/86  Pulse: 65 71  Resp: 19 19  Temp:    SpO2: 99% 100%     General: Awake, interactive  CV:  Regular rate, good peripheral perfusion.  Resp:  Unlabored respirations, lungs good auscultation Abd:  Nondistended, soft, nontender to palpation Neuro:  Alert and oriented, normal extraocular movements, symmetric facial movement, sensation intact over bilateral upper and lower extremities with 5 out of 5 strength.  Normal  finger-to-nose testing.   ED Results / Procedures / Treatments   Labs (all labs ordered are listed, but only abnormal results are displayed) Labs Reviewed  COMPREHENSIVE METABOLIC PANEL - Abnormal; Notable for the following components:      Result Value   CO2 20 (*)    All other components within normal limits  CBC WITH DIFFERENTIAL/PLATELET  TROPONIN I (HIGH SENSITIVITY)  TROPONIN I (HIGH SENSITIVITY)     EKG EKG independently reviewed interpreted by myself (ER attending) demonstrates:  EKG demonstrates sinus rhythm at rate 71, PR 192, QRS 80, QTc 421, no acute ST changes  RADIOLOGY Imaging independently reviewed and interpreted by myself demonstrates:  CXR without focal consolidation  PROCEDURES:  Critical Care performed: No  Procedures   MEDICATIONS ORDERED IN ED: Medications  acetaminophen (TYLENOL) tablet 1,000 mg (1,000 mg Oral Given 05/26/23 1615)  diphenhydrAMINE (BENADRYL) injection 25 mg (25 mg Intravenous Given 05/26/23 1617)  prochlorperazine (COMPAZINE) injection 10 mg (10 mg Intravenous Given 05/26/23 1616)     IMPRESSION / MDM / ASSESSMENT AND PLAN / ED COURSE  I reviewed the triage vital signs and the nursing notes.  Differential diagnosis includes, but is not  limited to, ACS, pneumonia, pneumothorax, low risk PE and PERC negative, low suspicion for intracranial bleed, other acute intracranial process given history of similar headaches, reassuring neurologic exam  Patient's presentation is most consistent with acute presentation with potential threat to life or bodily function.  43 year old female presenting with chest pain and headache.  Stable vitals.  Labs reassuring including negative troponin x 2.  CXR without focal findings.  EKG without acute ischemic findings.  Chest pain resolved.  Headache similar in character to prior.  I am able to see if she has previously been seen in our ER and was treated with Tylenol, Benadryl, Compazine with  improvement.  She was given this and on reevaluation, reports significant improvement in her headache.  She is comfortable discharge home.  With reassuring workup, do think this is reasonable.  Strict return precautions provided.  Patient discharged stable condition.     FINAL CLINICAL IMPRESSION(S) / ED DIAGNOSES   Final diagnoses:  Nonspecific chest pain  Acute nonintractable headache, unspecified headache type     Rx / DC Orders   ED Discharge Orders     None        Note:  This document was prepared using Dragon voice recognition software and may include unintentional dictation errors.   Trinna Post, MD 05/26/23 [phone]

## 2023-05-26 NOTE — ED Triage Notes (Signed)
 Pt from home called EMS due to having Ls ided chest pain starting 45 minutes ago, pt states it is a 10/10 pain right now  Pt states she has had a migraine since yesterday and has not taken anything for it, and normally has to come here for relief.

## 2023-06-05 ENCOUNTER — Other Ambulatory Visit: Payer: Self-pay | Admitting: Family Medicine

## 2023-06-05 DIAGNOSIS — Z1231 Encounter for screening mammogram for malignant neoplasm of breast: Secondary | ICD-10-CM

## 2023-09-12 ENCOUNTER — Emergency Department
Admission: EM | Admit: 2023-09-12 | Discharge: 2023-09-12 | Disposition: A | Discharge status: Home / Self Care | Attending: Emergency Medicine | Admitting: Emergency Medicine

## 2023-09-12 ENCOUNTER — Other Ambulatory Visit: Payer: Self-pay

## 2023-09-12 DIAGNOSIS — N3001 Acute cystitis with hematuria: Secondary | ICD-10-CM | POA: Insufficient documentation

## 2023-09-12 DIAGNOSIS — R319 Hematuria, unspecified: Secondary | ICD-10-CM | POA: Diagnosis present

## 2023-09-12 LAB — CBC WITH DIFFERENTIAL/PLATELET
Abs Immature Granulocytes: 0 10*3/uL (ref 0.00–0.07)
Basophils Absolute: 0.1 10*3/uL (ref 0.0–0.1)
Basophils Relative: 1 %
Eosinophils Absolute: 0.2 10*3/uL (ref 0.0–0.5)
Eosinophils Relative: 4 %
HCT: 36.7 % (ref 36.0–46.0)
Hemoglobin: 12.1 g/dL (ref 12.0–15.0)
Immature Granulocytes: 0 %
Lymphocytes Relative: 41 %
Lymphs Abs: 2.1 10*3/uL (ref 0.7–4.0)
MCH: 27.9 pg (ref 26.0–34.0)
MCHC: 33 g/dL (ref 30.0–36.0)
MCV: 84.6 fL (ref 80.0–100.0)
Monocytes Absolute: 0.6 10*3/uL (ref 0.1–1.0)
Monocytes Relative: 11 %
Neutro Abs: 2.3 10*3/uL (ref 1.7–7.7)
Neutrophils Relative %: 43 %
Platelets: 311 10*3/uL (ref 150–400)
RBC: 4.34 MIL/uL (ref 3.87–5.11)
RDW: 14.1 % (ref 11.5–15.5)
WBC: 5.2 10*3/uL (ref 4.0–10.5)
nRBC: 0 % (ref 0.0–0.2)

## 2023-09-12 LAB — BASIC METABOLIC PANEL WITH GFR
Anion gap: 7 (ref 5–15)
BUN: 12 mg/dL (ref 6–20)
CO2: 19 mmol/L — ABNORMAL LOW (ref 22–32)
Calcium: 8.6 mg/dL — ABNORMAL LOW (ref 8.9–10.3)
Chloride: 108 mmol/L (ref 98–111)
Creatinine, Ser: 1 mg/dL (ref 0.44–1.00)
GFR, Estimated: >60 mL/min (ref >60)
Glucose, Bld: 85 mg/dL (ref 70–99)
Potassium: 4.2 mmol/L (ref 3.5–5.1)
Sodium: 134 mmol/L — ABNORMAL LOW (ref 135–145)

## 2023-09-12 LAB — URINALYSIS, ROUTINE W REFLEX MICROSCOPIC
Bilirubin Urine: NEGATIVE
Glucose, UA: NEGATIVE mg/dL
Ketones, ur: NEGATIVE mg/dL
Nitrite: POSITIVE — AB
Protein, ur: NEGATIVE mg/dL
Specific Gravity, Urine: 1.018 (ref 1.005–1.030)
pH: 5 (ref 5.0–8.0)

## 2023-09-12 LAB — POC URINE PREG, ED: Preg Test, Ur: NEGATIVE

## 2023-09-12 MED ORDER — NAPROXEN 500 MG PO TABS: 500.0000 mg | ORAL_TABLET | Freq: Two times a day (BID) | ORAL | 2 refills | Status: AC

## 2023-09-12 MED ORDER — CEPHALEXIN 500 MG PO CAPS
500.0000 mg | ORAL_CAPSULE | Freq: Once | ORAL | Status: AC
  Administered 2023-09-12: 500 mg via ORAL
  Filled 2023-09-12: qty 1

## 2023-09-12 MED ORDER — CEPHALEXIN 500 MG PO CAPS: 500.0000 mg | ORAL_CAPSULE | Freq: Three times a day (TID) | ORAL | 0 refills | Status: AC

## 2023-09-12 NOTE — ED Provider Notes (Signed)
 [facility] Provider Note    Event Date/Time   First MD Initiated Contact with Patient 09/12/23 0701     (approximate)   History   Hematuria   HPI  Nancy Watson is a 43 y.o. female with a history of bilateral tubal ligation who presents with complaints of hematuria and foul-smelling urine.  She denies fevers or chills.  No nausea or vomiting.  No diarrhea.  Symptoms started overnight, no significant flank pain     Physical Exam   Triage Vital Signs: ED Triage Vitals  Encounter Vitals Group     BP 09/12/23 0647 120/73     Systolic BP Percentile --      Diastolic BP Percentile --      Pulse Rate 09/12/23 0647 88     Resp 09/12/23 0647 16     Temp 09/12/23 0647 98.6 F (37 C)     Temp Source 09/12/23 0647 Oral     SpO2 09/12/23 0647 100 %     Weight 09/12/23 0647 68 kg (150 lb)     Height 09/12/23 0647 1.676 m (5' 6)     Head Circumference --      Peak Flow --      Pain Score 09/12/23 0726 3     Pain Loc --      Pain Education --      Exclude from Growth Chart --     Most recent vital signs: Vitals:   09/12/23 0647  BP: 120/73  Pulse: 88  Resp: 16  Temp: 98.6 F (37 C)  SpO2: 100%     General: Awake, no distress.  CV:  Good peripheral perfusion.  Resp:  Normal effort.  Abd:  No distention.  No CVA tenderness, abdomen soft and nontender, reassuring exam Other:     ED Results / Procedures / Treatments   Labs (all labs ordered are listed, but only abnormal results are displayed) Labs Reviewed  BASIC METABOLIC PANEL WITH GFR - Abnormal; Notable for the following components:      Result Value   Sodium 134 (*)    CO2 19 (*)    Calcium 8.6 (*)    All other components within normal limits  URINALYSIS, ROUTINE W REFLEX MICROSCOPIC - Abnormal; Notable for the following components:   Color, Urine YELLOW (*)    APPearance HAZY (*)    Hgb urine dipstick MODERATE (*)    Nitrite POSITIVE (*)    Leukocytes,Ua SMALL (*)     Bacteria, UA RARE (*)    All other components within normal limits  CBC WITH DIFFERENTIAL/PLATELET  POC URINE PREG, ED     EKG     RADIOLOGY     PROCEDURES:  Critical Care performed:   Procedures   MEDICATIONS ORDERED IN ED: Medications  cephALEXin  (KEFLEX ) capsule 500 mg (500 mg Oral Given 09/12/23 0827)     IMPRESSION / MDM / ASSESSMENT AND PLAN / ED COURSE  I reviewed the triage vital signs and the nursing notes. Patient's presentation is most consistent with acute presentation with potential threat to life or bodily function.  Patient presents with hematuria foul-smelling urine as above, suspicious for urinary tract infection, differential includes pyelonephritis, kidney stone  Will obtain urinalysis, labs and reevaluate.  Urinalysis consistent with UTI, lab work reassuring.  Will start p.o. Keflex , prescription provided, strict return precautions if any fever chills worsening symptoms, she agrees to this plan, work note provided      FINAL  CLINICAL IMPRESSION(S) / ED DIAGNOSES   Final diagnoses:  Acute cystitis with hematuria     Rx / DC Orders   ED Discharge Orders          Ordered    cephALEXin  (KEFLEX ) 500 MG capsule  3 times daily        09/12/23 0818    naproxen  (NAPROSYN ) 500 MG tablet  2 times daily with meals        09/12/23 0818             Note:  This document was prepared using Dragon voice recognition software and may include unintentional dictation errors.   Bryson Carbine, MD 09/12/23 [phone]

## 2023-09-12 NOTE — ED Triage Notes (Addendum)
 Pt reports she woke up this morning with hematuria and lower abd pain. Pt denies blood thinner use. Reports lower back pain also . Denies hx of same

## 2023-09-28 ENCOUNTER — Other Ambulatory Visit: Payer: Self-pay

## 2023-09-28 ENCOUNTER — Emergency Department
Admission: EM | Admit: 2023-09-28 | Discharge: 2023-09-28 | Disposition: A | Discharge status: Home / Self Care | Attending: Emergency Medicine | Admitting: Emergency Medicine

## 2023-09-28 ENCOUNTER — Emergency Department

## 2023-09-28 DIAGNOSIS — R079 Chest pain, unspecified: Secondary | ICD-10-CM | POA: Insufficient documentation

## 2023-09-28 LAB — CBC
HCT: 37.9 % (ref 36.0–46.0)
Hemoglobin: 12.7 g/dL (ref 12.0–15.0)
MCH: 28.8 pg (ref 26.0–34.0)
MCHC: 33.5 g/dL (ref 30.0–36.0)
MCV: 85.9 fL (ref 80.0–100.0)
Platelets: 303 10*3/uL (ref 150–400)
RBC: 4.41 MIL/uL (ref 3.87–5.11)
RDW: 14.4 % (ref 11.5–15.5)
WBC: 5.9 10*3/uL (ref 4.0–10.5)
nRBC: 0 % (ref 0.0–0.2)

## 2023-09-28 LAB — BASIC METABOLIC PANEL WITH GFR
Anion gap: 6 (ref 5–15)
BUN: 13 mg/dL (ref 6–20)
CO2: 22 mmol/L (ref 22–32)
Calcium: 8.7 mg/dL — ABNORMAL LOW (ref 8.9–10.3)
Chloride: 108 mmol/L (ref 98–111)
Creatinine, Ser: 0.94 mg/dL (ref 0.44–1.00)
GFR, Estimated: >60 mL/min (ref >60)
Glucose, Bld: 125 mg/dL — ABNORMAL HIGH (ref 70–99)
Potassium: 4.1 mmol/L (ref 3.5–5.1)
Sodium: 136 mmol/L (ref 135–145)

## 2023-09-28 LAB — TROPONIN I (HIGH SENSITIVITY): Troponin I (High Sensitivity): 2 ng/L (ref <18)

## 2023-09-28 NOTE — ED Notes (Signed)
 This RN reviewed paperwork with pt. No further complaints or questions. Pt ambulated to lobby.

## 2023-09-28 NOTE — ED Provider Notes (Signed)
 [facility] Provider Note    Event Date/Time   First MD Initiated Contact with Patient 09/28/23 1221     (approximate)   History   Chest Pain   HPI  Nancy Watson is a 43 year old female presenting to the emergency department for evaluation of chest pain.  Was moving boxes yesterday.  This morning around 3 AM she woke up with pain in the center of her chest, nonradiating.  No associated shortness of breath, nausea, diaphoresis.  Has been constant since that time.  Is not specifically worse with palpation or movements.  Denies history of similar.  No lightheadedness, syncope.  Triage note says concerns for abnormal EKG at urgent care.  Patient does have her EKG and documentation from urgent care.  EKG notable for short PR, otherwise I do not appreciate any significant abnormalities on her EKG, documentation from urgent care notes that they sent patient to the ER in setting of chest pain for further workup and I do not see any specific comment about an abnormal EKG.    Physical Exam   Triage Vital Signs: ED Triage Vitals [09/28/23 1137]  Encounter Vitals Group     BP 127/81     Girls Systolic BP Percentile      Girls Diastolic BP Percentile      Boys Systolic BP Percentile      Boys Diastolic BP Percentile      Pulse Rate 83     Resp 16     Temp 98.6 F (37 C)     Temp Source Oral     SpO2 100 %     Weight 216 lb 0.8 oz (98 kg)     Height      Head Circumference      Peak Flow      Pain Score 8     Pain Loc      Pain Education      Exclude from Growth Chart     Most recent vital signs: Vitals:   09/28/23 1137  BP: 127/81  Pulse: 83  Resp: 16  Temp: 98.6 F (37 C)  SpO2: 100%     General: Awake, interactive  CV:  Regular rate, good peripheral perfusion.  Resp:  Unlabored respirations, lungs good auscultation Chest wall: Nontender to palpation Abd:  Nondistended, soft, nontender Neuro:  Symmetric facial movement, fluid  speech   ED Results / Procedures / Treatments   Labs (all labs ordered are listed, but only abnormal results are displayed) Labs Reviewed  BASIC METABOLIC PANEL WITH GFR - Abnormal; Notable for the following components:      Result Value   Glucose, Bld 125 (*)    Calcium 8.7 (*)    All other components within normal limits  CBC  TROPONIN I (HIGH SENSITIVITY)     EKG EKG independently reviewed and interpreted by myself demonstrates:  EKG demonstrates normal sinus rhythm rate of 79, PR 116, QRS 68, QTc 433, no acute ST changes  RADIOLOGY Imaging independently reviewed and interpreted by myself demonstrates:  CXR without focal consolidation   Formal Radiology Read:  DG Chest 2 View Result Date: 09/28/2023 CLINICAL DATA:  Chest pain. EXAM: CHEST - 2 VIEW COMPARISON:  May 26, 2023. FINDINGS: The heart size and mediastinal contours are within normal limits. Both lungs are clear. The visualized skeletal structures are unremarkable. IMPRESSION: No active cardiopulmonary disease. Electronically Signed   By: Lynwood Landy Raddle M.D.   On: 09/28/2023 13:08  PROCEDURES:  Critical Care performed: No  Procedures   MEDICATIONS ORDERED IN ED: Medications - No data to display   IMPRESSION / MDM / ASSESSMENT AND PLAN / ED COURSE  I reviewed the triage vital signs and the nursing notes.  Differential diagnosis includes, but is not limited to ACS, pneumonia, pneumothorax, musculoskeletal strain, stress-mediated physiologic response  Patient's presentation is most consistent with acute presentation with potential threat to life or bodily function.  Presents with chest pain. Labs, EKG, CXR reassuring. Negative troponin and greater than 3 hours of symptoms. Low risk HEART score. Low suspicion emergent process. Do think patient is stable for discharge with close outpatient follow-up. Strict return precautions provided.     FINAL CLINICAL IMPRESSION(S) / ED DIAGNOSES   Final  diagnoses:  Nonspecific chest pain     Rx / DC Orders   ED Discharge Orders     None        Note:  This document was prepared using Dragon voice recognition software and may include unintentional dictation errors.   Levander Slate, MD 09/28/23 [phone]

## 2023-09-28 NOTE — Discharge Instructions (Addendum)
 I reviewed the triage vital signs and the nursing notes.  Differential diagnosis includes, but is not limited to ACS, pneumonia, pneumothorax, low risk PE and PERC negative, musculoskeletal strain, stress-mediated physiologic response  Patient's presentation is most consistent with acute presentation with potential threat to life or bodily function.  Presents with chest pain. Labs, EKG, CXR reassuring. Negative troponin and greater than 3 hours of symptoms. Low risk HEART score. Low suspicion emergent process. Do think patient is stable for discharge with close outpatient follow-up. Strict return precautions provided.

## 2023-09-28 NOTE — ED Triage Notes (Signed)
 Arrives from urgent care c/o chest pain this mroning. REferred to ED due to abnormal EKG.  AAOx3.  Skin warm and dry. NAD

## 2024-01-25 ENCOUNTER — Ambulatory Visit

## 2024-02-27 ENCOUNTER — Other Ambulatory Visit: Payer: Self-pay | Admitting: Obstetrics and Gynecology

## 2024-02-27 DIAGNOSIS — N939 Abnormal uterine and vaginal bleeding, unspecified: Secondary | ICD-10-CM
